# Patient Record
Sex: Female | Born: 1980 | Race: Black or African American | Hispanic: No | Smoking: Current every day smoker
Health system: Southern US, Community
[De-identification: ages and names within clinical notes are randomized; demographics above are authoritative.]

## PROBLEM LIST (undated history)

## (undated) DIAGNOSIS — I10 Essential (primary) hypertension: Secondary | ICD-10-CM

## (undated) DIAGNOSIS — N183 Chronic kidney disease, stage 3 unspecified: Secondary | ICD-10-CM

## (undated) HISTORY — PX: HAND SURGERY: SHX662

## (undated) HISTORY — DX: Essential (primary) hypertension: I10

## (undated) HISTORY — DX: Chronic kidney disease, stage 3 unspecified: N18.30

---

## 2018-07-11 ENCOUNTER — Encounter: Payer: Self-pay | Admitting: Anesthesiology

## 2018-07-11 ENCOUNTER — Emergency Department: Payer: Self-pay

## 2018-07-11 ENCOUNTER — Encounter
Admission: EM | Disposition: A | Discharge status: Home / Self Care | Payer: Self-pay | Source: Home / Self Care | Attending: Emergency Medicine

## 2018-07-11 ENCOUNTER — Observation Stay
Admission: EM | Admit: 2018-07-11 | Discharge: 2018-07-11 | Disposition: A | Discharge status: Home / Self Care | Payer: Self-pay | Attending: Surgery | Admitting: Surgery

## 2018-07-11 ENCOUNTER — Encounter: Payer: Self-pay | Admitting: Emergency Medicine

## 2018-07-11 ENCOUNTER — Other Ambulatory Visit: Payer: Self-pay

## 2018-07-11 DIAGNOSIS — N390 Urinary tract infection, site not specified: Secondary | ICD-10-CM | POA: Insufficient documentation

## 2018-07-11 DIAGNOSIS — S5291XA Unspecified fracture of right forearm, initial encounter for closed fracture: Secondary | ICD-10-CM | POA: Diagnosis present

## 2018-07-11 DIAGNOSIS — S52301A Unspecified fracture of shaft of right radius, initial encounter for closed fracture: Principal | ICD-10-CM | POA: Insufficient documentation

## 2018-07-11 DIAGNOSIS — W010XXA Fall on same level from slipping, tripping and stumbling without subsequent striking against object, initial encounter: Secondary | ICD-10-CM | POA: Insufficient documentation

## 2018-07-11 DIAGNOSIS — F17210 Nicotine dependence, cigarettes, uncomplicated: Secondary | ICD-10-CM | POA: Insufficient documentation

## 2018-07-11 DIAGNOSIS — S52201A Unspecified fracture of shaft of right ulna, initial encounter for closed fracture: Secondary | ICD-10-CM | POA: Insufficient documentation

## 2018-07-11 LAB — CBC WITH DIFFERENTIAL/PLATELET
Abs Immature Granulocytes: 0.03 10*3/uL (ref 0.00–0.07)
BASOS ABS: 0.1 10*3/uL (ref 0.0–0.1)
Basophils Relative: 1 %
EOS PCT: 2 %
Eosinophils Absolute: 0.2 10*3/uL (ref 0.0–0.5)
HCT: 50 % — ABNORMAL HIGH (ref 36.0–46.0)
Hemoglobin: 16 g/dL — ABNORMAL HIGH (ref 12.0–15.0)
Immature Granulocytes: 0 %
Lymphocytes Relative: 41 %
Lymphs Abs: 4.5 10*3/uL — ABNORMAL HIGH (ref 0.7–4.0)
MCH: 28.5 pg (ref 26.0–34.0)
MCHC: 32 g/dL (ref 30.0–36.0)
MCV: 89.1 fL (ref 80.0–100.0)
Monocytes Absolute: 0.7 10*3/uL (ref 0.1–1.0)
Monocytes Relative: 6 %
NRBC: 0 % (ref 0.0–0.2)
Neutro Abs: 5.7 10*3/uL (ref 1.7–7.7)
Neutrophils Relative %: 50 %
Platelets: 182 10*3/uL (ref 150–400)
RBC: 5.61 MIL/uL — ABNORMAL HIGH (ref 3.87–5.11)
RDW: 13.2 % (ref 11.5–15.5)
WBC: 11.1 10*3/uL — ABNORMAL HIGH (ref 4.0–10.5)

## 2018-07-11 LAB — BASIC METABOLIC PANEL
Anion gap: 11 (ref 5–15)
BUN: 9 mg/dL (ref 6–20)
CO2: 20 mmol/L — ABNORMAL LOW (ref 22–32)
Calcium: 9.2 mg/dL (ref 8.9–10.3)
Chloride: 107 mmol/L (ref 98–111)
Creatinine, Ser: 1.21 mg/dL — ABNORMAL HIGH (ref 0.44–1.00)
GFR calc non Af Amer: 58 mL/min — ABNORMAL LOW (ref >60)
Glucose, Bld: 117 mg/dL — ABNORMAL HIGH (ref 70–99)
Potassium: 3.1 mmol/L — ABNORMAL LOW (ref 3.5–5.1)
Sodium: 138 mmol/L (ref 135–145)

## 2018-07-11 LAB — URINALYSIS, ROUTINE W REFLEX MICROSCOPIC
Bacteria, UA: NONE SEEN
Bilirubin Urine: NEGATIVE
Glucose, UA: NEGATIVE mg/dL
Ketones, ur: NEGATIVE mg/dL
NITRITE: NEGATIVE
Protein, ur: 100 mg/dL — AB
Specific Gravity, Urine: 1.015 (ref 1.005–1.030)
WBC, UA: >50 WBC/hpf — ABNORMAL HIGH (ref 0–5)
pH: 5 (ref 5.0–8.0)

## 2018-07-11 LAB — URINE DRUG SCREEN, QUALITATIVE (ARMC ONLY)
Amphetamines, Ur Screen: POSITIVE — AB
BENZODIAZEPINE, UR SCRN: NOT DETECTED
Barbiturates, Ur Screen: NOT DETECTED
Cannabinoid 50 Ng, Ur ~~LOC~~: POSITIVE — AB
Cocaine Metabolite,Ur ~~LOC~~: POSITIVE — AB
MDMA (Ecstasy)Ur Screen: NOT DETECTED
Methadone Scn, Ur: NOT DETECTED
Opiate, Ur Screen: POSITIVE — AB
Phencyclidine (PCP) Ur S: NOT DETECTED
Tricyclic, Ur Screen: NOT DETECTED

## 2018-07-11 LAB — SURGICAL PCR SCREEN
MRSA, PCR: NEGATIVE
Staphylococcus aureus: NEGATIVE

## 2018-07-11 LAB — PROTIME-INR
INR: 0.96
Prothrombin Time: 12.7 seconds (ref 11.4–15.2)

## 2018-07-11 SURGERY — OPEN REDUCTION INTERNAL FIXATION (ORIF) RADIAL FRACTURE
Anesthesia: Choice | Laterality: Right

## 2018-07-11 MED ORDER — DOCUSATE SODIUM 100 MG PO CAPS: 100.0000 mg | ORAL_CAPSULE | Freq: Two times a day (BID) | ORAL | Status: DC

## 2018-07-11 MED ORDER — PANTOPRAZOLE SODIUM 40 MG IV SOLR: 40.0000 mg | Freq: Every day | INTRAVENOUS | Status: DC

## 2018-07-11 MED ORDER — FLEET ENEMA 7-19 GM/118ML RE ENEM: 1.0000 | ENEMA | Freq: Once | RECTAL | Status: DC | PRN

## 2018-07-11 MED ORDER — MAGNESIUM HYDROXIDE 400 MG/5ML PO SUSP: 30.0000 mL | Freq: Every day | ORAL | Status: DC | PRN

## 2018-07-11 MED ORDER — SODIUM CHLORIDE 0.9 % IV SOLN
Freq: Once | INTRAVENOUS | Status: AC
  Administered 2018-07-11: 05:00:00 via INTRAVENOUS

## 2018-07-11 MED ORDER — ONDANSETRON HCL 4 MG/2ML IJ SOLN
4.0000 mg | INTRAMUSCULAR | Status: AC
  Administered 2018-07-11: 4 mg via INTRAVENOUS
  Filled 2018-07-11: qty 2

## 2018-07-11 MED ORDER — SODIUM CHLORIDE 0.9 % IV SOLN
1.0000 g | INTRAVENOUS | Status: AC
  Administered 2018-07-11: 1 g via INTRAVENOUS
  Filled 2018-07-11: qty 10

## 2018-07-11 MED ORDER — BISACODYL 10 MG RE SUPP
10.0000 mg | Freq: Every day | RECTAL | Status: DC | PRN
  Filled 2018-07-11: qty 1

## 2018-07-11 MED ORDER — CEFAZOLIN SODIUM-DEXTROSE 2-4 GM/100ML-% IV SOLN
2.0000 g | Freq: Once | INTRAVENOUS | Status: DC
  Filled 2018-07-11: qty 100

## 2018-07-11 MED ORDER — SULFAMETHOXAZOLE-TRIMETHOPRIM 800-160 MG PO TABS: 1.0000 | ORAL_TABLET | Freq: Two times a day (BID) | ORAL | 0 refills | Status: DC

## 2018-07-11 MED ORDER — OXYCODONE HCL 5 MG PO TABS: 5.0000 mg | ORAL_TABLET | ORAL | 0 refills | Status: DC | PRN

## 2018-07-11 MED ORDER — KCL IN DEXTROSE-NACL 20-5-0.9 MEQ/L-%-% IV SOLN
INTRAVENOUS | Status: DC
  Administered 2018-07-11: 08:00:00 via INTRAVENOUS
  Filled 2018-07-11 (×3): qty 1000

## 2018-07-11 MED ORDER — HYDROMORPHONE HCL 1 MG/ML IJ SOLN
0.5000 mg | INTRAMUSCULAR | Status: DC | PRN
  Administered 2018-07-11 (×2): 1 mg via INTRAVENOUS
  Filled 2018-07-11 (×2): qty 1

## 2018-07-11 MED ORDER — MORPHINE SULFATE (PF) 4 MG/ML IV SOLN
8.0000 mg | Freq: Once | INTRAVENOUS | Status: AC
  Administered 2018-07-11: 8 mg via INTRAVENOUS
  Filled 2018-07-11: qty 2

## 2018-07-11 MED ORDER — ACETAMINOPHEN 650 MG RE SUPP: 650.0000 mg | Freq: Four times a day (QID) | RECTAL | Status: DC | PRN

## 2018-07-11 MED ORDER — ACETAMINOPHEN 325 MG PO TABS: 650.0000 mg | ORAL_TABLET | Freq: Four times a day (QID) | ORAL | Status: DC | PRN

## 2018-07-11 MED ORDER — FENTANYL CITRATE (PF) 100 MCG/2ML IJ SOLN
100.0000 ug | Freq: Once | INTRAMUSCULAR | Status: AC | PRN
  Administered 2018-07-11: 100 ug via INTRAVENOUS
  Filled 2018-07-11: qty 2

## 2018-07-11 SURGICAL SUPPLY — 39 items
BNDG COHESIVE 4X5 TAN STRL (GAUZE/BANDAGES/DRESSINGS) ×3 IMPLANT
BNDG ESMARK 4X12 TAN STRL LF (GAUZE/BANDAGES/DRESSINGS) ×3 IMPLANT
CANISTER SUCT 1200ML W/VALVE (MISCELLANEOUS) ×3 IMPLANT
CHLORAPREP W/TINT 26ML (MISCELLANEOUS) ×6 IMPLANT
CORD BIP STRL DISP 12FT (MISCELLANEOUS) ×3 IMPLANT
COVER WAND RF STERILE (DRAPES) ×3 IMPLANT
CUFF TOURN 18 STER (MISCELLANEOUS) IMPLANT
DRAPE FLUOR MINI C-ARM 54X84 (DRAPES) ×3 IMPLANT
DRAPE IMP U-DRAPE 54X76 (DRAPES) ×3 IMPLANT
DRAPE SURG 17X11 SM STRL (DRAPES) ×3 IMPLANT
DRAPE U-SHAPE 47X51 STRL (DRAPES) ×3 IMPLANT
ELECT CAUTERY BLADE 6.4 (BLADE) ×3 IMPLANT
ELECT REM PT RETURN 9FT ADLT (ELECTROSURGICAL) ×3
ELECTRODE REM PT RTRN 9FT ADLT (ELECTROSURGICAL) ×1 IMPLANT
FORCEPS JEWEL BIP 4-3/4 STR (INSTRUMENTS) ×3 IMPLANT
GAUZE PETRO XEROFOAM 1X8 (MISCELLANEOUS) ×3 IMPLANT
GAUZE SPONGE 4X4 12PLY STRL (GAUZE/BANDAGES/DRESSINGS) ×3 IMPLANT
GLOVE BIO SURGEON STRL SZ8 (GLOVE) ×3 IMPLANT
GLOVE INDICATOR 8.0 STRL GRN (GLOVE) ×3 IMPLANT
GOWN STRL REUS W/ TWL LRG LVL3 (GOWN DISPOSABLE) ×1 IMPLANT
GOWN STRL REUS W/ TWL XL LVL3 (GOWN DISPOSABLE) ×1 IMPLANT
GOWN STRL REUS W/TWL LRG LVL3 (GOWN DISPOSABLE) ×2
GOWN STRL REUS W/TWL XL LVL3 (GOWN DISPOSABLE) ×2
KIT TURNOVER KIT A (KITS) ×3 IMPLANT
NEEDLE FILTER BLUNT 18X 1/2SAF (NEEDLE) ×2
NEEDLE FILTER BLUNT 18X1 1/2 (NEEDLE) ×1 IMPLANT
NS IRRIG 500ML POUR BTL (IV SOLUTION) ×3 IMPLANT
PACK EXTREMITY ARMC (MISCELLANEOUS) ×3 IMPLANT
PAD CAST CTTN 4X4 STRL (SOFTGOODS) ×2 IMPLANT
PADDING CAST COTTON 4X4 STRL (SOFTGOODS) ×4
SPLINT CAST 1 STEP 3X12 (MISCELLANEOUS) ×3 IMPLANT
STAPLER SKIN PROX 35W (STAPLE) ×3 IMPLANT
STOCKINETTE IMPERVIOUS 9X36 MD (GAUZE/BANDAGES/DRESSINGS) ×3 IMPLANT
SUT PROLENE 4 0 PS 2 18 (SUTURE) ×3 IMPLANT
SUT VIC AB 2-0 SH 27 (SUTURE) ×2
SUT VIC AB 2-0 SH 27XBRD (SUTURE) ×1 IMPLANT
SUT VIC AB 3-0 SH 27 (SUTURE) ×2
SUT VIC AB 3-0 SH 27X BRD (SUTURE) ×1 IMPLANT
SYR 10ML LL (SYRINGE) ×3 IMPLANT

## 2018-07-11 NOTE — ED Notes (Signed)
Transport patient to floor room 149.AS

## 2018-07-11 NOTE — Discharge Instructions (Signed)
Orthopedic discharge instructions: Keep splint dry and intact. Keep hand elevated above heart level. Apply ice to affected area frequently. Take pain medication as prescribed or ES Tylenol when needed.  Return for follow-up in 3-4 days.

## 2018-07-11 NOTE — ED Triage Notes (Signed)
Pt arrives to ED with obvious deformity to right arm. Pt is screaming at this time.

## 2018-07-11 NOTE — Progress Notes (Signed)
DISCHARGE NOTE:  Pt given discharge instructions and script (oxycodone, bactrim). Pt verbalized understanding. Pt wheeled to car by staff.

## 2018-07-11 NOTE — H&P (Signed)
Subjective:  Chief complaint: Right forearm injury.  The patient is a 38 y.o. female who sustained an injury to the right forearm early this morning when she apparently slipped on some mud and fell onto her outstretched right hand.  She was brought to the emergency room where x-rays demonstrated a displaced both bone forearm fracture.  The patient was splinted and admitted in preparation for definitive management of this injury.  The patient denies any associated injury or loss of consciousness associated with the injury, and denies any light-headedness, loss of consciousness, chest pain, or shortness of breath which might have contributed to the injury.  The patient does note some tingling to the index finger, but denies any other symptoms other than pain.  Patient Active Problem List   Diagnosis Date Noted  . Forearm fracture, right, closed, initial encounter 07/11/2018   History reviewed. No pertinent past medical history.  Past Surgical History:  Procedure Laterality Date  . HAND SURGERY      Medications Prior to Admission  Medication Sig Dispense Refill Last Dose  . TESTOSTERONE PROPIONATE IM Inject into the muscle once a week.   07/04/2018 at Unknown time   Allergies  Allergen Reactions  . Ibuprofen     Social History   Tobacco Use  . Smoking status: Current Every Day Smoker    Types: Cigarettes  . Smokeless tobacco: Never Used  Substance Use Topics  . Alcohol use: Not Currently    No family history on file.   Review of Systems: As noted above. The patient denies any chest pain, shortness of breath, nausea, vomiting, diarrhea, constipation, belly pain, blood in his/her stool, or burning with urination.  Objective: Temp:  [97.9 F (36.6 C)-98 F (36.7 C)] 98 F (36.7 C) (01/05 0652) Pulse Rate:  [69-84] 80 (01/05 0652) Resp:  [15-24] 15 (01/05 0652) BP: (135-158)/(78-94) 137/94 (01/05 0530) SpO2:  [92 %-98 %] 95 % (01/05 0652) Weight:  [95.3 kg] 95.3 kg (01/05  0241)  Physical Exam: General:  Alert, no acute distress Psychiatric:  Patient is competent for consent with normal mood and affect Cardiovascular:  RRR  Respiratory:  Clear to auscultation. No wheezing. Non-labored breathing GI:  Abdomen is soft and non-tender Skin:  No lesions in the area of chief complaint Neurologic:  Sensation intact distally Lymphatic:  No axillary or cervical lymphadenopathy  Orthopedic Exam:  Orthopedic examination is limited to the right upper extremity and hand.  A sugar tong splint has been applied to the right forearm and appears to be intact.  Skin is intact at the proximal and distal margins of the splint.  The patient is able to flex and extend the thumb and all other digits slightly, although range of motion is limited by pain and apprehension.  Sensation is intact light touch to the tips of all digits, although she notes mild "tingling" to the index finger tip.  She has excellent capillary refill to all digits.  Imaging Review: Recent AP and lateral x-rays of the right forearm are available for review.  These films demonstrate displaced fracture midshaft fractures of both the radius and ulna.  Minimal comminution of the ulnar fracture is noted.  No other acute bony abnormalities are identified.  Assessment: Closed displaced right both bone forearm fracture.  Plan: The treatment options, including both surgical and nonsurgical choices, have been discussed in detail with the patient and her friend, who is at the bedside.  I have explained to the patient that this injury requires surgical  stabilization, specifically an open reduction and internal fixation of both the radius and ulnar shaft fractures.  This procedure has been discussed in detail with the patient, as have the potential risks, including bleeding, infection, nerve and blood vessel injury, persistent recurrent pain, malunion or nonunion, forearm/wrist stiffness, need for further surgery, blood clots,  strokes, heart attacks and arrhythmias, etc.) benefits.  The patient states her understanding wishes to proceed.    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  Therefore, the patient will be discharged home and arrangements will be made for this procedure to be done later this week, assuming she remains clean.  The patient and her friend are not happy with this plan, but I have explained to them that I cannot do the surgery without the patient being put to sleep.  She has mentioned that she may choose to go to another facility.  I have responded that this would be her choice.

## 2018-07-11 NOTE — ED Provider Notes (Signed)
[facility] Emergency Department Provider Note  ____________________________________________   First MD Initiated Contact with Patient 07/11/18 [phone]     (approximate)  I have reviewed the triage vital signs and the nursing notes.   HISTORY  Chief Complaint Arm Pain  Level 5 caveat:  history/ROS limited by severe pain and she is unable to provide extensive history.  History is provided by her companion.  HPI Gwendolyn Harrison is a 38 y.o. female who presents by private vehicle for evaluation of acute onset and severe pain in the right forearm.  Her companion reports that the patient slipped in the mud while headed to her car and fell on her arm causing acute onset severe constant pain.  The pain is worse with any movement and she reports that she cannot or will not move her fingers.  No additional history is available other than that the patient has had prior surgery on her right hand already.  The surgery occurred years ago and was not around here.  She did not sustain any injuries other than to the right forearm.  She is left-hand dominant.  History reviewed. No pertinent past medical history.  Patient Active Problem List   Diagnosis Date Noted  . Forearm fracture, right, closed, initial encounter 07/11/2018    Past Surgical History:  Procedure Laterality Date  . HAND SURGERY      Prior to Admission medications   Medication Sig Start Date End Date Taking? Authorizing Provider  TESTOSTERONE PROPIONATE IM Inject into the muscle once a week.   Yes [provider]    Allergies Ibuprofen  No family history on file.  Social History Social History   Tobacco Use  . Smoking status: Current Every Day Smoker    Types: Cigarettes  . Smokeless tobacco: Never Used  Substance Use Topics  . Alcohol use: Not Currently  . Drug use: Never    Review of Systems Level 5 caveat:  history/ROS limited by severe pain and she is unable to provide extensive  history.  History is provided by her companion.  The only pain the patient is reporting at this time is in the right forearm.  ____________________________________________   PHYSICAL EXAM:  ED Triage Vitals  Enc Vitals Group     BP 07/11/18 0247 (!) 158/78     Pulse Rate 07/11/18 0247 84     Resp 07/11/18 0247 (!) 24     Temp 07/11/18 0247 97.9 F (36.6 C)     Temp Source 07/11/18 0247 Oral     SpO2 07/11/18 0247 98 %     Weight 07/11/18 0241 95.3 kg (210 lb)     Height 07/11/18 0241 1.727 m (5\' 8" )     Head Circumference --      Peak Flow --      Pain Score 07/11/18 0244 10     Pain Loc --      Pain Edu? --      Excl. in GC? --      Constitutional: Awake and alert but screaming in pain and minimally cooperative with ED provider and staff. Eyes: Conjunctivae are normal.  Head: Atraumatic. Neck: No stridor.  No meningeal signs.  No cervical spine tenderness to palpation. Cardiovascular: Normal rate, regular rhythm. Good peripheral circulation. Grossly normal heart sounds. Respiratory: Normal respiratory effort.  No retractions. Lungs CTAB. Gastrointestinal: Soft and nontender. No distention.  Musculoskeletal: Deformity of distal right forearm which is likely consistent with a radial fracture.  Additional evaluation  is not possible due to the patient's pain and lack of cooperation.  Palpable right radial pulse although any palpation causes the patient to scream and attempt to move away.  Compartments are soft and easily compressible at this time. Neurologic: Patient reports numbness and tingling in her fingers.  Otherwise unable to participate in exam. Skin:  Skin is warm, dry and intact.  The patient has innumerable old healing scars that appear most consistent with self-inflicted cutting behavior.   ____________________________________________   LABS (all labs ordered are listed, but only abnormal results are displayed)  Labs Reviewed  BASIC METABOLIC PANEL - Abnormal;  Notable for the following components:      Result Value   Potassium 3.1 (*)    CO2 20 (*)    Glucose, Bld 117 (*)    Creatinine, Ser 1.21 (*)    GFR calc non Af Amer 58 (*)    All other components within normal limits  CBC WITH DIFFERENTIAL/PLATELET - Abnormal; Notable for the following components:   WBC 11.1 (*)    RBC 5.61 (*)    Hemoglobin 16.0 (*)    HCT 50.0 (*)    Lymphs Abs 4.5 (*)    All other components within normal limits  URINALYSIS, ROUTINE W REFLEX MICROSCOPIC - Abnormal; Notable for the following components:   Color, Urine YELLOW (*)    APPearance HAZY (*)    Hgb urine dipstick MODERATE (*)    Protein, ur 100 (*)    Leukocytes, UA LARGE (*)    WBC, UA >50 (*)    All other components within normal limits  URINE DRUG SCREEN, QUALITATIVE ([facility] ONLY) - Abnormal; Notable for the following components:   Amphetamines, Ur Screen POSITIVE (*)    Cocaine Metabolite,Ur [place] POSITIVE (*)    Opiate, Ur Screen POSITIVE (*)    Cannabinoid 50 Ng, Ur [place] POSITIVE (*)    All other components within normal limits  URINE CULTURE  PROTIME-INR  POC URINE PREG, ED   ____________________________________________  EKG  None - EKG not ordered by ED physician ____________________________________________  RADIOLOGY Marylou Mccoy, personally viewed and evaluated these images (plain radiographs) as part of my medical decision making, as well as reviewing the written report by the radiologist.  ED MD interpretation: Severely displaced fractures of the midshaft of both the radius and ulna with dorsal displacement and shortening.  Official radiology report(s): Dg Forearm Right  Result Date: 07/11/2018 CLINICAL DATA:  Status post fall. Left forearm pain and deformity. Initial encounter. EXAM: RIGHT FOREARM - 2 VIEW COMPARISON:  None. FINDINGS: There are significantly displaced fractures of the midshafts of the radius and ulna, with more than 1 shaft width dorsal displacement and shortening,  and mild rotation. Surrounding soft tissue swelling is noted. The elbow joint is grossly unremarkable in appearance. No elbow joint effusion is identified. The carpal rows appear grossly intact, and demonstrate normal alignment. IMPRESSION: Significantly displaced fractures of the midshafts of the radius and ulna, with more than 1 shaft width dorsal displacement and shortening, and mild rotation. Electronically Signed   By: Roanna Raider M.D.   On: 07/11/2018 03:12    ____________________________________________   PROCEDURES  Critical Care performed: No   Procedure(s) performed:   .Splint Application Date/Time: 07/11/2018 4:03 AM Performed by: Loleta Rose, MD Authorized by: Loleta Rose, MD   Consent:    Consent obtained:  Verbal and emergent situation   Consent given by:  Patient   Risks discussed:  Numbness, pain and  swelling Pre-procedure details:    Sensation:  Numbness and weakness Procedure details:    Laterality:  Right   Location:  Arm   Arm:  R lower arm   Splint type:  Sugar tong   Supplies:  Ortho-Glass Post-procedure details:    Pain:  Unchanged   Patient tolerance of procedure:  Tolerated with difficulty Comments:     tolerated with difficulty due to pain     ____________________________________________   INITIAL IMPRESSION / ASSESSMENT AND PLAN / ED COURSE  As part of my medical decision making, I reviewed the following data within the electronic MEDICAL RECORD NUMBER History obtained from family, Nursing notes reviewed and incorporated, Labs reviewed , Radiograph reviewed , Discussed with admitting physician , Discussed with radiologist, A consult was requested from this/these consultant(s) Orthopedics (talked with Dr. Joice Lofts by phone) and Notes from prior ED visits    Differential diagnosis includes, but is not limited to, midshaft fractures, development of compartment syndrome, hand or upper arm fractures, neurovascular compromise due to the severity of  fracture.  Anticipate that fractures will be present but they seem to be isolated to the forearm.  At this time the compartments are soft and easily compressible although any attempt at physical exam results and severe pain and causes the patient to scream.  I am ordering morphine 8 mg IV and Zofran 4 mg IV as well as radiographs of the right arm and basic lab work.  I will not place the arm in a splint until after we have imaging to determine what needs to be done.  I did asked the patient if she was sure that this injury occurred as result of a fall and she insists that it was from the fall and that she was not harmed by anyone.  Clinical Course as of Jul 11 600  Wynelle Link Jul 11, 2018  0344 I spoke with Dr. Cherly Hensen with radiology to verify the results of the imaging as well as to ask about any apparent injury to the patient's hand.  He said that there might be some evidence of prior injury but there is no hardware in place and no evidence of acute fracture to the hand in spite of the severely displaced fractures of the midshaft of her radius and ulna.  I will contact Dr. Reginia Naas with orthopedics to discuss.  DG Forearm Right [CF]  0405 I have already spoken with Dr. Joice Lofts about the case.  He reviewed the radiographs and agreed that he would admit the patient to the hospital for surgery later this morning.  He requested that I not attempt any reduction but that we place the patient in a sugar tong splint for stabilization.  I updated the patient and her companion and they understand and agree with the plan.  I explained that it was necessary to check a urinalysis to prepare for surgery and we will also be sending a protime/INR.  I have ordered fentanyl 100 mcg IV to be administered just prior to splint application.  I have made the patient n.p.o. and ordered a normal saline infusion at 100 mL/h.   [CF]  0453 I reassessed the patient after splint placement and the compartments that are accessible to me remain soft  and easily compressible.  The patient still is unwilling or unable to move her fingers but reports no change of her neurovascular status after the splint placement.  She has normal capillary refill in her fingers.   [CF]    Clinical Course  User Index [CF] Loleta RoseForbach, Ephrata Verville, MD    ____________________________________________  FINAL CLINICAL IMPRESSION(S) / ED DIAGNOSES  Final diagnoses:  Closed fracture of shaft of radius and ulna, right, initial encounter  Urinary tract infection without hematuria, site unspecified     MEDICATIONS GIVEN DURING THIS VISIT:  Medications  HYDROmorphone (DILAUDID) injection 0.5-1 mg (1 mg Intravenous Given 07/11/18 0507)  ceFAZolin (ANCEF) IVPB 2g/100 mL premix (has no administration in time range)  cefTRIAXone (ROCEPHIN) 1 g in sodium chloride 0.9 % 100 mL IVPB (1 g Intravenous New Bag/Given 07/11/18 0544)  morphine 4 MG/ML injection 8 mg (8 mg Intravenous Given 07/11/18 0251)  ondansetron (ZOFRAN) injection 4 mg (4 mg Intravenous Given 07/11/18 0251)  fentaNYL (SUBLIMAZE) injection 100 mcg (100 mcg Intravenous Given 07/11/18 0405)  0.9 %  sodium chloride infusion ( Intravenous New Bag/Given 07/11/18 0434)     ED Discharge Orders    None       Note:  This document was prepared using Dragon voice recognition software and may include unintentional dictation errors.    Loleta RoseForbach, Ranya Fiddler, MD 07/11/18 [phone]

## 2018-07-12 LAB — URINE CULTURE
Culture: <10000 — AB
Special Requests: NORMAL

## 2018-10-09 ENCOUNTER — Other Ambulatory Visit: Payer: Self-pay

## 2018-10-09 ENCOUNTER — Emergency Department
Admission: EM | Admit: 2018-10-09 | Discharge: 2018-10-09 | Disposition: A | Discharge status: Home / Self Care | Payer: Medicaid Other | Attending: Emergency Medicine | Admitting: Emergency Medicine

## 2018-10-09 ENCOUNTER — Encounter: Payer: Self-pay | Admitting: Emergency Medicine

## 2018-10-09 ENCOUNTER — Emergency Department: Payer: Medicaid Other

## 2018-10-09 DIAGNOSIS — Y999 Unspecified external cause status: Secondary | ICD-10-CM | POA: Insufficient documentation

## 2018-10-09 DIAGNOSIS — S93402A Sprain of unspecified ligament of left ankle, initial encounter: Secondary | ICD-10-CM | POA: Insufficient documentation

## 2018-10-09 DIAGNOSIS — Y929 Unspecified place or not applicable: Secondary | ICD-10-CM | POA: Insufficient documentation

## 2018-10-09 DIAGNOSIS — X500XXA Overexertion from strenuous movement or load, initial encounter: Secondary | ICD-10-CM | POA: Insufficient documentation

## 2018-10-09 DIAGNOSIS — Y9389 Activity, other specified: Secondary | ICD-10-CM | POA: Insufficient documentation

## 2018-10-09 DIAGNOSIS — F17210 Nicotine dependence, cigarettes, uncomplicated: Secondary | ICD-10-CM | POA: Insufficient documentation

## 2018-10-09 DIAGNOSIS — S93602A Unspecified sprain of left foot, initial encounter: Secondary | ICD-10-CM | POA: Insufficient documentation

## 2018-10-09 MED ORDER — TRAMADOL HCL 50 MG PO TABS: 50.0000 mg | ORAL_TABLET | Freq: Four times a day (QID) | ORAL | 0 refills | Status: DC | PRN

## 2018-10-09 NOTE — ED Notes (Signed)
Ace wrap applied and crutches given to pt adjusted for size.

## 2018-10-09 NOTE — ED Provider Notes (Signed)
[facility] Emergency Department Provider Note   ____________________________________________   First MD Initiated Contact with Patient 10/09/18 1655     (approximate)  I have reviewed the triage vital signs and the nursing notes.   HISTORY  Chief Complaint Fall   HPI Gwendolyn Harrison is a 38 y.o. female presents today with complaint of left foot and ankle pain.  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  She states that today she is unable to apply pressure and walk without severe pain.  She denies any previous fractures in this area.  She has not taken any over-the-counter medication.  She denies any head injury or loss of consciousness last evening.  She rates her pain as a 10/10.     History reviewed. No pertinent past medical history.  Patient Active Problem List   Diagnosis Date Noted  . Forearm fracture, right, closed, initial encounter 07/11/2018    Past Surgical History:  Procedure Laterality Date  . HAND SURGERY      Prior to Admission medications   Medication Sig Start Date End Date Taking? Authorizing Provider  TESTOSTERONE PROPIONATE IM Inject into the muscle once a week.    [provider]  traMADol (ULTRAM) 50 MG tablet Take 1 tablet (50 mg total) by mouth every 6 (six) hours as needed. 10/09/18   Tommi Rumps, PA-C    Allergies Ibuprofen  No family history on file.  Social History Social History   Tobacco Use  . Smoking status: Current Every Day Smoker    Types: Cigarettes  . Smokeless tobacco: Never Used  Substance Use Topics  . Alcohol use: Not Currently  . Drug use: Never    Review of Systems Constitutional: No fever/chills Cardiovascular: Denies chest pain. Respiratory: Denies shortness of breath. Musculoskeletal: Positive for left ankle and foot pain. Skin: Negative for rash. Neurological: Negative for headaches, focal weakness or numbness.  ___________________________________________   PHYSICAL EXAM:  VITAL SIGNS: ED Triage Vitals  Enc Vitals Group     BP 10/09/18 1633 128/83     Pulse Rate 10/09/18 1633 90     Resp 10/09/18 1633 18     Temp 10/09/18 1633 98.2 F (36.8 C)     Temp Source 10/09/18 1633 Oral     SpO2 10/09/18 1633 98 %     Weight 10/09/18 1634 200 lb (90.7 kg)     Height 10/09/18 1634 5\' 5"  (1.651 m)     Head Circumference --      Peak Flow --      Pain Score 10/09/18 1634 10     Pain Loc --      Pain Edu? --      Excl. in GC? --    Constitutional: Alert and oriented. Well appearing and in no acute distress. Eyes: Conjunctivae are normal.  Head: Atraumatic. Neck: No stridor.   Cardiovascular: Normal rate, regular rhythm. Grossly normal heart sounds.  Good peripheral circulation. Respiratory: Normal respiratory effort.  No retractions. Lungs CTAB. Musculoskeletal: On examination of the left ankle there is no gross deformity however there is soft tissue tenderness especially on the lateral aspect.  No ecchymosis or abrasions were seen.  On examination of the foot dorsal aspect there is some soft tissue edema present.  Generalized tenderness to the dorsal aspect.  Patient is able to move digits without any difficulty.  Motor sensory function intact.  Capillary refills less than 3 seconds. Neurologic:  Normal speech and language.  No gross focal neurologic deficits are appreciated.  Skin:  Skin is warm, dry and intact.  Psychiatric: Mood and affect are normal. Speech and behavior are normal.  ____________________________________________   LABS (all labs ordered are listed, but only abnormal results are displayed)  Labs Reviewed - No data to display RADIOLOGY   Official radiology report(s): Dg Ankle Complete Left  Result Date: 10/09/2018 CLINICAL DATA:  38 year old female with injury to the left foot/ankle. EXAM: LEFT FOOT - COMPLETE 3+ VIEW; LEFT ANKLE COMPLETE - 3+ VIEW COMPARISON:  None.  FINDINGS: There is no acute fracture or dislocation. The bones are well mineralized. No arthritic changes. The ankle mortise is intact. There is soft tissue swelling over the lateral malleolus. No radiopaque foreign object. IMPRESSION: 1. No acute fracture or dislocation. 2. Soft tissue swelling over the lateral malleolus. Electronically Signed   By: Elgie Collard M.D.   On: 10/09/2018 17:32   Dg Foot Complete Left  Result Date: 10/09/2018 CLINICAL DATA:  38 year old female with injury to the left foot/ankle. EXAM: LEFT FOOT - COMPLETE 3+ VIEW; LEFT ANKLE COMPLETE - 3+ VIEW COMPARISON:  None. FINDINGS: There is no acute fracture or dislocation. The bones are well mineralized. No arthritic changes. The ankle mortise is intact. There is soft tissue swelling over the lateral malleolus. No radiopaque foreign object. IMPRESSION: 1. No acute fracture or dislocation. 2. Soft tissue swelling over the lateral malleolus. Electronically Signed   By: Elgie Collard M.D.   On: 10/09/2018 17:32    ____________________________________________   PROCEDURES  Procedure(s) performed (including Critical Care):  Procedures Ace wrap and crutches were done by the RN.  ____________________________________________   INITIAL IMPRESSION / ASSESSMENT AND PLAN / ED COURSE  As part of my medical decision making, I reviewed the following data within the electronic MEDICAL RECORD NUMBER Notes from prior ED visits and [place] Controlled Substance Database  Patient presents to the ED with complaint of left foot pain since last evening when she tripped.  Physical exam shows soft tissue swelling but no gross deformity was noted.  X-rays were negative for acute bony injury.  Patient was placed in an Ace wrap with instructions to ice and elevate.  She was also given crutches to avoid weightbearing at this time.  A prescription for tramadol was given to take every 6 hours as needed for pain.  She will follow-up with her PCP or Dr.  Martha Clan if any continued problems.  ____________________________________________   FINAL CLINICAL IMPRESSION(S) / ED DIAGNOSES  Final diagnoses:  Sprain of left ankle, unspecified ligament, initial encounter  Sprain of left foot, initial encounter     ED Discharge Orders         Ordered    traMADol (ULTRAM) 50 MG tablet  Every 6 hours PRN     10/09/18 1757           Note:  This document was prepared using Dragon voice recognition software and may include unintentional dictation errors.    Tommi Rumps, PA-C 10/09/18 1916    Sharman Cheek, MD 10/10/18 2245

## 2018-10-09 NOTE — Discharge Instructions (Signed)
Follow-up with your primary care provider or Dr. Martha Clan who is on-call for orthopedics if any worsening of your ankle sprain.  Use crutches for walking.  Ice and elevation to reduce swelling and help with pain.  Take tramadol every 6 hours as needed for pain.

## 2018-10-09 NOTE — ED Triage Notes (Signed)
States she tripped last night, fell and now has pain in her left foot/ankle. NAD. States she is unable to put pressure on it at this time.

## 2019-05-31 IMAGING — DX DG FOREARM 2V*R*
2 series · 2 of 2 positions shown · non-contrast
Comparison: None.

CLINICAL DATA: Status post fall. Left forearm pain and deformity.
Initial encounter.

EXAM:
RIGHT FOREARM - 2 VIEW

[forearm ap]
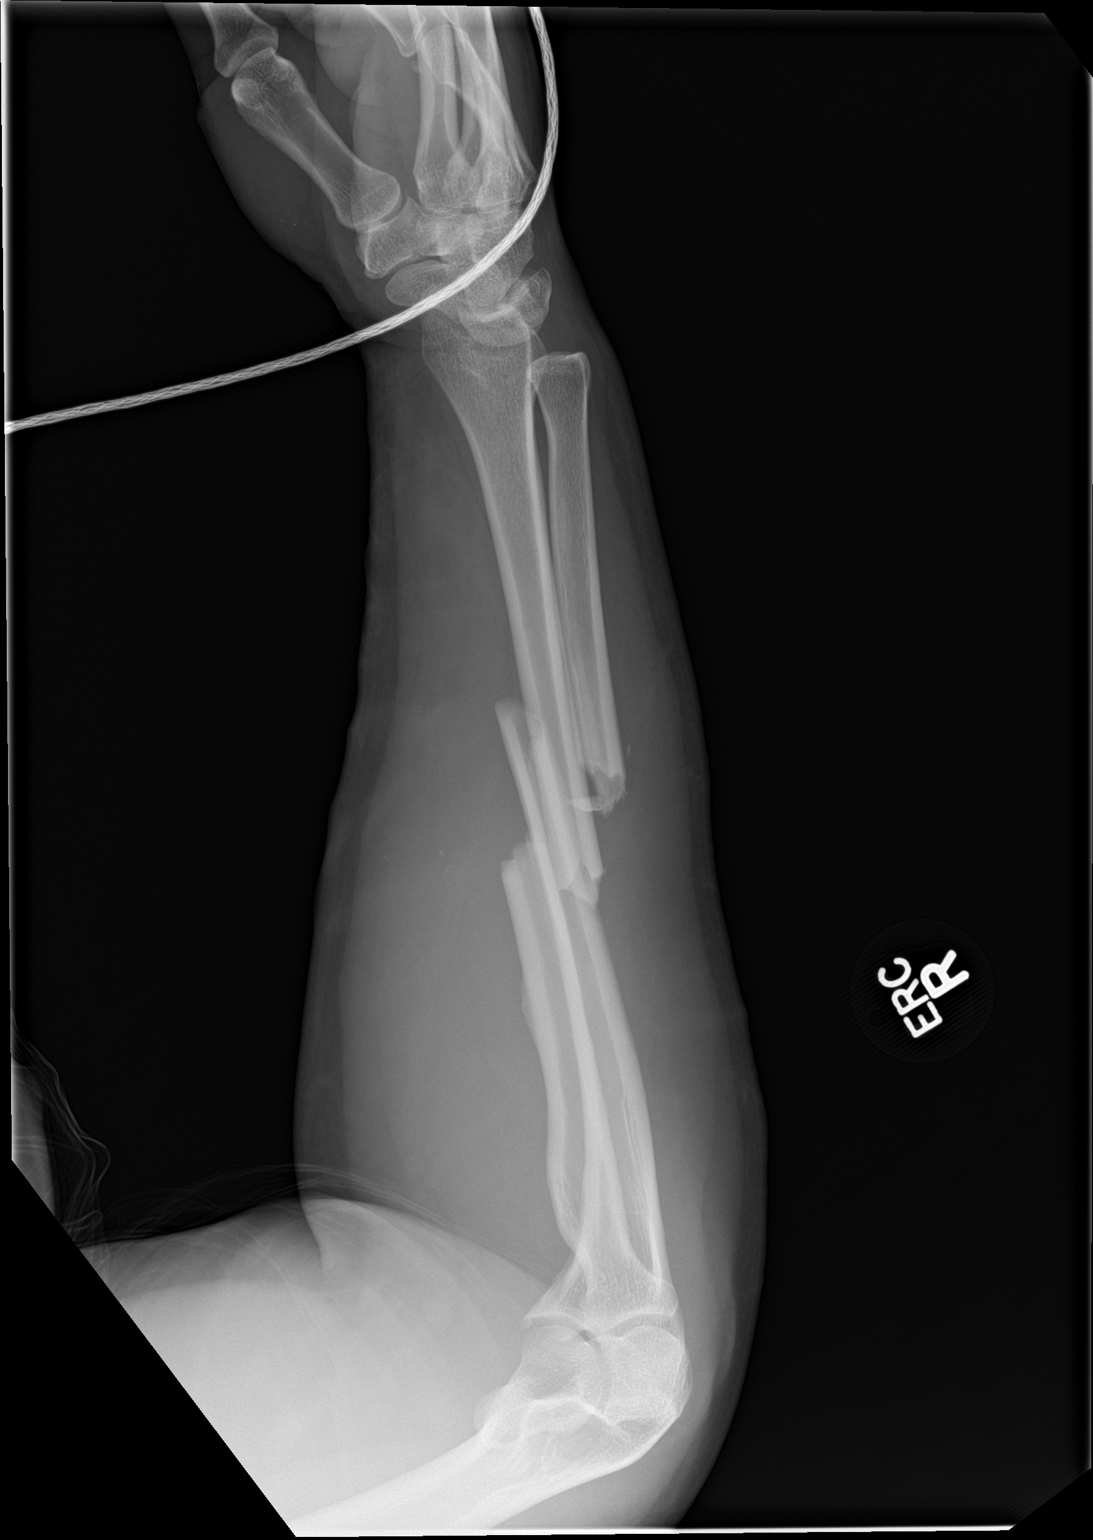

[forearm lat]
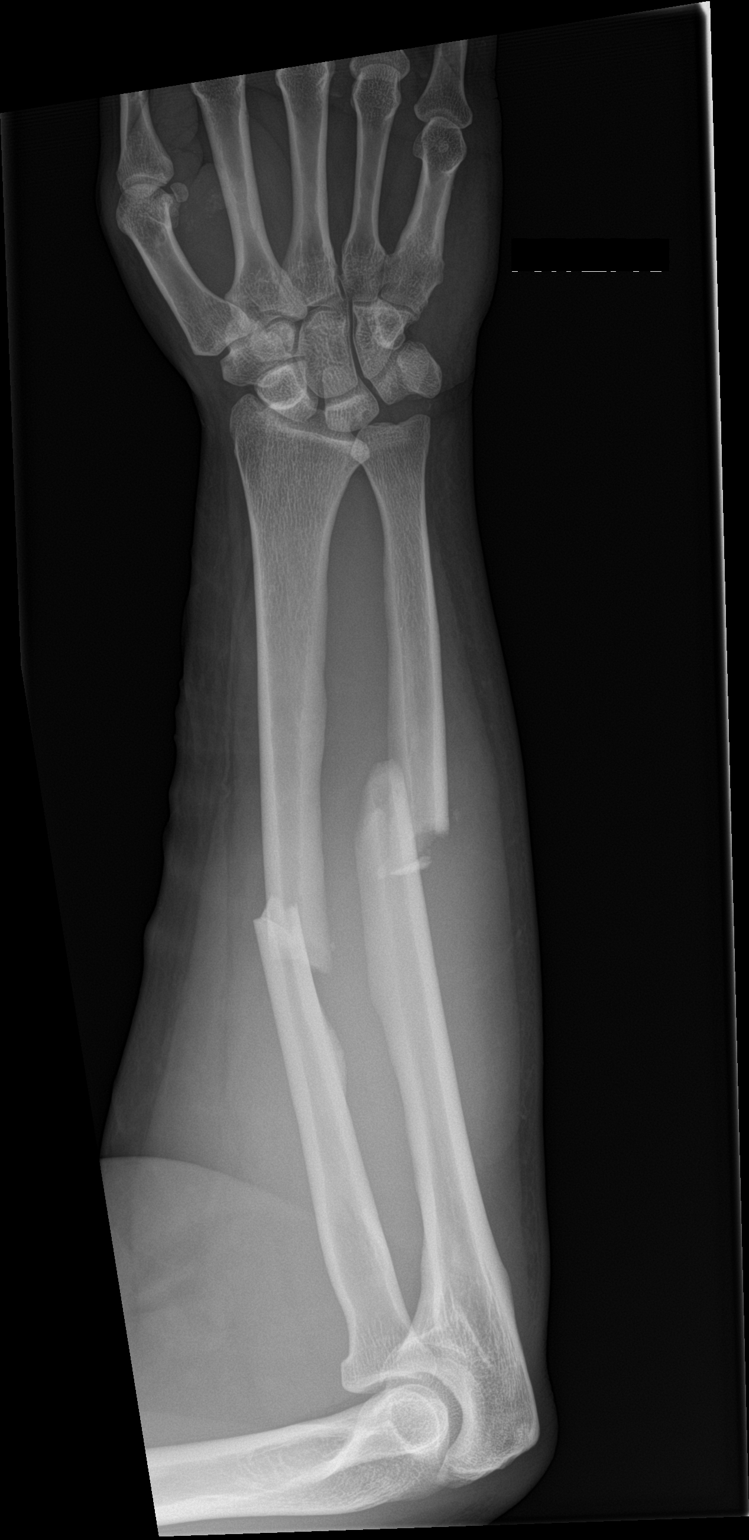

[2 of 2 positions shown; findings below may reference images not displayed]

FINDINGS: There are significantly displaced fractures of the midshafts of the
radius and ulna, with more than 1 shaft width dorsal displacement
and shortening, and mild rotation. Surrounding soft tissue swelling
is noted.

The elbow joint is grossly unremarkable in appearance. No elbow
joint effusion is identified. The carpal rows appear grossly intact,
and demonstrate normal alignment.
IMPRESSION: Significantly displaced fractures of the midshafts of the radius and
ulna, with more than 1 shaft width dorsal displacement and
shortening, and mild rotation.

## 2019-12-14 ENCOUNTER — Other Ambulatory Visit: Payer: Self-pay

## 2019-12-14 ENCOUNTER — Emergency Department: Payer: Worker's Compensation

## 2019-12-14 DIAGNOSIS — X500XXA Overexertion from strenuous movement or load, initial encounter: Secondary | ICD-10-CM | POA: Diagnosis not present

## 2019-12-14 DIAGNOSIS — Y939 Activity, unspecified: Secondary | ICD-10-CM | POA: Insufficient documentation

## 2019-12-14 DIAGNOSIS — S6991XA Unspecified injury of right wrist, hand and finger(s), initial encounter: Secondary | ICD-10-CM | POA: Diagnosis present

## 2019-12-14 DIAGNOSIS — F17210 Nicotine dependence, cigarettes, uncomplicated: Secondary | ICD-10-CM | POA: Insufficient documentation

## 2019-12-14 DIAGNOSIS — Y9259 Other trade areas as the place of occurrence of the external cause: Secondary | ICD-10-CM | POA: Insufficient documentation

## 2019-12-14 DIAGNOSIS — S63612A Unspecified sprain of right middle finger, initial encounter: Secondary | ICD-10-CM | POA: Insufficient documentation

## 2019-12-14 DIAGNOSIS — Z79899 Other long term (current) drug therapy: Secondary | ICD-10-CM | POA: Insufficient documentation

## 2019-12-14 DIAGNOSIS — Y990 Civilian activity done for income or pay: Secondary | ICD-10-CM | POA: Insufficient documentation

## 2019-12-14 NOTE — ED Triage Notes (Signed)
Pt was at work a day go and machine pulled his right 3rd middle finger back, pt co pain to hand and finger.

## 2019-12-15 ENCOUNTER — Emergency Department
Admission: EM | Admit: 2019-12-15 | Discharge: 2019-12-15 | Disposition: A | Discharge status: Home / Self Care | Payer: Worker's Compensation | Attending: Emergency Medicine | Admitting: Emergency Medicine

## 2019-12-15 DIAGNOSIS — S63612A Unspecified sprain of right middle finger, initial encounter: Secondary | ICD-10-CM

## 2019-12-15 MED ORDER — ACETAMINOPHEN 325 MG PO TABS: 650.0000 mg | ORAL_TABLET | Freq: Once | ORAL | Status: DC

## 2019-12-15 NOTE — ED Provider Notes (Signed)
[facility] Emergency Department Provider Note  ____________________________________________   First MD Initiated Contact with Patient 12/15/19 0404     (approximate)  I have reviewed the triage vital signs and the nursing notes.   HISTORY  Chief Complaint Finger Injury    HPI Gwendolyn Harrison is a 39 y.o. female presents to the emergency department secondary to right middle finger pain and swelling after she states that it was "pulled by a machine at work yesterday.  Patient states that pain is worse with movement of the finger and attempting to flex that finger.       No past medical history on file.  Patient Active Problem List   Diagnosis Date Noted   Forearm fracture, right, closed, initial encounter 07/11/2018    Past Surgical History:  Procedure Laterality Date   HAND SURGERY      Prior to Admission medications   Medication Sig Start Date End Date Taking? Authorizing Provider  TESTOSTERONE PROPIONATE IM Inject into the muscle once a week.    [provider]  traMADol (ULTRAM) 50 MG tablet Take 1 tablet (50 mg total) by mouth every 6 (six) hours as needed. 10/09/18   Johnn Hai, PA-C    Allergies Ibuprofen  No family history on file.  Social History Social History   Tobacco Use   Smoking status: Current Every Day Smoker    Types: Cigarettes   Smokeless tobacco: Never Used  Substance Use Topics   Alcohol use: Not Currently   Drug use: Never    Review of Systems Constitutional: No fever/chills Eyes: No visual changes. ENT: No sore throat. Cardiovascular: Denies chest pain. Respiratory: Denies shortness of breath. Gastrointestinal: No abdominal pain.  No nausea, no vomiting.  No diarrhea.  No constipation. Genitourinary: Negative for dysuria. Musculoskeletal: Negative for neck pain.  Negative for back pain.  Positive for right ring finger pain Integumentary: Negative for rash. Neurological: Negative for  headaches, focal weakness or numbness.   ____________________________________________   PHYSICAL EXAM:  VITAL SIGNS: ED Triage Vitals  Enc Vitals Group     BP 12/14/19 2314 135/83     Pulse Rate 12/14/19 2314 84     Resp 12/14/19 2314 20     Temp 12/14/19 2314 98.4 F (36.9 C)     Temp Source 12/14/19 2314 Oral     SpO2 12/14/19 2314 100 %     Weight 12/14/19 2315 95.3 kg (210 lb)     Height 12/14/19 2315 1.651 m (5\' 5" )     Head Circumference --      Peak Flow --      Pain Score 12/14/19 2315 6     Pain Loc --      Pain Edu? --      Excl. in [place]? --     Constitutional: Alert and oriented.  Eyes: Conjunctivae are normal.  Mouth/Throat: Patient is wearing a mask. Neck: No stridor.  No meningeal signs.   Gastrointestinal: Soft and nontender. No distention.  Musculoskeletal: Right middle finger swelling tenderness to palpation.  Range of motion limited secondary to discomfort. Neurologic:  Normal speech and language. No gross focal neurologic deficits are appreciated.  Skin:  Skin is warm, dry and intact. Psychiatric: Mood and affect are normal. Speech and behavior are normal.   RADIOLOGY I, McCoole, personally viewed and evaluated these images (plain radiographs) as part of my medical decision making, as well as reviewing the written report by the radiologist.  ED  MD interpretation: No acute fracture or dislocation noted on right hand x-ray per radiology  Official radiology report(s): DG Hand Complete Right  Result Date: 12/14/2019 CLINICAL DATA:  Right hand pain, hyperextension injury third digit EXAM: RIGHT HAND - COMPLETE 3+ VIEW COMPARISON:  None. FINDINGS: Frontal, oblique, and lateral views of the right hand are obtained. Prior healed fifth metacarpal fracture. No acute fracture, subluxation, or dislocation. Joint spaces are well preserved. Soft tissues are normal. IMPRESSION: 1. No acute fracture. Electronically Signed   By: Sharlet Salina M.D.   On:  12/14/2019 23:44    ______ Procedures   ____________________________________________   INITIAL IMPRESSION / MDM / ASSESSMENT AND PLAN / ED COURSE  As part of my medical decision making, I reviewed the following data within the electronic MEDICAL RECORD NUMBER   39 year old female presented with above-stated history and physical exam secondary to right middle finger injury.  X-ray revealed no evidence of fracture or dislocation.  Finger splint applied.  ___________________________________  FINAL CLINICAL IMPRESSION(S) / ED DIAGNOSES  Final diagnoses:  Sprain of right middle finger, unspecified site of digit, initial encounter     MEDICATIONS GIVEN DURING THIS VISIT:  Medications - No data to display   ED Discharge Orders    None      *Please note:  [name] was evaluated in Emergency Department on 12/15/2019 for the symptoms described in the history of present illness. She was evaluated in the context of the global COVID-19 pandemic, which necessitated consideration that the patient might be at risk for infection with the SARS-CoV-2 virus that causes COVID-19. Institutional protocols and algorithms that pertain to the evaluation of patients at risk for COVID-19 are in a state of rapid change based on information released by regulatory bodies including the CDC and federal and state organizations. These policies and algorithms were followed during the patient's care in the ED.  Some ED evaluations and interventions may be delayed as a result of limited staffing during the pandemic.*  Note:  This document was prepared using Dragon voice recognition software and may include unintentional dictation errors.   Darci Current, MD 12/19/19 [phone]

## 2020-04-09 ENCOUNTER — Emergency Department: Payer: Medicaid Other

## 2020-04-09 ENCOUNTER — Other Ambulatory Visit: Payer: Self-pay

## 2020-04-09 DIAGNOSIS — M79631 Pain in right forearm: Secondary | ICD-10-CM | POA: Diagnosis not present

## 2020-04-09 DIAGNOSIS — Z5321 Procedure and treatment not carried out due to patient leaving prior to being seen by health care provider: Secondary | ICD-10-CM | POA: Insufficient documentation

## 2020-04-09 NOTE — ED Triage Notes (Signed)
Pt presents via POV c/o right forearm x1 month. Reports "bumped" arm. Reports hx of surgery to same forearm.

## 2020-04-10 ENCOUNTER — Emergency Department
Admission: EM | Admit: 2020-04-10 | Discharge: 2020-04-10 | Disposition: A | Discharge status: Home / Self Care | Payer: Medicaid Other | Attending: Emergency Medicine | Admitting: Emergency Medicine

## 2020-04-10 NOTE — ED Notes (Signed)
No answer when called several times from lobby 

## 2020-11-23 ENCOUNTER — Other Ambulatory Visit: Payer: Self-pay

## 2020-11-23 ENCOUNTER — Encounter: Payer: Self-pay | Admitting: Emergency Medicine

## 2020-11-23 ENCOUNTER — Emergency Department
Admission: EM | Admit: 2020-11-23 | Discharge: 2020-11-23 | Disposition: A | Discharge status: Home / Self Care | Payer: Medicaid Other | Attending: Emergency Medicine | Admitting: Emergency Medicine

## 2020-11-23 DIAGNOSIS — B9689 Other specified bacterial agents as the cause of diseases classified elsewhere: Secondary | ICD-10-CM | POA: Diagnosis not present

## 2020-11-23 DIAGNOSIS — M545 Low back pain, unspecified: Secondary | ICD-10-CM

## 2020-11-23 DIAGNOSIS — N390 Urinary tract infection, site not specified: Secondary | ICD-10-CM

## 2020-11-23 DIAGNOSIS — R509 Fever, unspecified: Secondary | ICD-10-CM | POA: Diagnosis present

## 2020-11-23 DIAGNOSIS — F17210 Nicotine dependence, cigarettes, uncomplicated: Secondary | ICD-10-CM | POA: Insufficient documentation

## 2020-11-23 DIAGNOSIS — R102 Pelvic and perineal pain: Secondary | ICD-10-CM

## 2020-11-23 DIAGNOSIS — N3001 Acute cystitis with hematuria: Secondary | ICD-10-CM | POA: Insufficient documentation

## 2020-11-23 LAB — BASIC METABOLIC PANEL
Anion gap: 12 (ref 5–15)
BUN: 20 mg/dL (ref 6–20)
CO2: 22 mmol/L (ref 22–32)
Calcium: 8.7 mg/dL — ABNORMAL LOW (ref 8.9–10.3)
Chloride: 95 mmol/L — ABNORMAL LOW (ref 98–111)
Creatinine, Ser: 1.47 mg/dL — ABNORMAL HIGH (ref 0.44–1.00)
GFR, Estimated: 47 mL/min — ABNORMAL LOW (ref >60)
Glucose, Bld: 181 mg/dL — ABNORMAL HIGH (ref 70–99)
Potassium: 3.3 mmol/L — ABNORMAL LOW (ref 3.5–5.1)
Sodium: 129 mmol/L — ABNORMAL LOW (ref 135–145)

## 2020-11-23 LAB — URINALYSIS, COMPLETE (UACMP) WITH MICROSCOPIC
Bilirubin Urine: NEGATIVE
Glucose, UA: NEGATIVE mg/dL
Ketones, ur: NEGATIVE mg/dL
Nitrite: NEGATIVE
Protein, ur: 100 mg/dL — AB
Specific Gravity, Urine: 1.028 (ref 1.005–1.030)
WBC, UA: >50 WBC/hpf — ABNORMAL HIGH (ref 0–5)
pH: 5 (ref 5.0–8.0)

## 2020-11-23 LAB — CBC WITH DIFFERENTIAL/PLATELET
Abs Immature Granulocytes: 0 10*3/uL (ref 0.00–0.07)
Basophils Absolute: 0 10*3/uL (ref 0.0–0.1)
Basophils Relative: 0 %
Eosinophils Absolute: 0 10*3/uL (ref 0.0–0.5)
Eosinophils Relative: 0 %
HCT: 43.3 % (ref 36.0–46.0)
Hemoglobin: 14.8 g/dL (ref 12.0–15.0)
Lymphocytes Relative: 9 %
Lymphs Abs: 1.3 10*3/uL (ref 0.7–4.0)
MCH: 28.7 pg (ref 26.0–34.0)
MCHC: 34.2 g/dL (ref 30.0–36.0)
MCV: 83.9 fL (ref 80.0–100.0)
Monocytes Absolute: 0.6 10*3/uL (ref 0.1–1.0)
Monocytes Relative: 4 %
Neutro Abs: 12.4 10*3/uL — ABNORMAL HIGH (ref 1.7–7.7)
Neutrophils Relative %: 87 %
Platelets: 135 10*3/uL — ABNORMAL LOW (ref 150–400)
RBC: 5.16 MIL/uL — ABNORMAL HIGH (ref 3.87–5.11)
RDW: 12.8 % (ref 11.5–15.5)
WBC: 14.2 10*3/uL — ABNORMAL HIGH (ref 4.0–10.5)
nRBC: 0 % (ref 0.0–0.2)

## 2020-11-23 LAB — POC URINE PREG, ED: Preg Test, Ur: NEGATIVE

## 2020-11-23 MED ORDER — CEFDINIR 300 MG PO CAPS: 300.0000 mg | ORAL_CAPSULE | Freq: Two times a day (BID) | ORAL | 0 refills | Status: AC

## 2020-11-23 MED ORDER — ACETAMINOPHEN 325 MG PO TABS
650.0000 mg | ORAL_TABLET | Freq: Once | ORAL | Status: AC | PRN
  Administered 2020-11-23: 650 mg via ORAL
  Filled 2020-11-23: qty 2

## 2020-11-23 NOTE — ED Notes (Signed)
Spoke with lab tech regarding previously sent urine sample and add on urine culture. Adequate sample provided earlier for testing.

## 2020-11-23 NOTE — ED Triage Notes (Signed)
Pt to ED via POV with c/o fever, lower back pain, pelvic pain, and fever. Pt states started with lower back pain 3 days ago, pain has moved into the pelvis now, fever (Tmax at home 102), and decreased appetite.

## 2020-11-24 LAB — URINE CULTURE

## 2020-11-27 NOTE — ED Provider Notes (Signed)
[facility] Emergency Department Provider Note   ____________________________________________   Event Date/Time   First MD Initiated Contact with Patient 11/23/20 1154     (approximate)  I have reviewed the triage vital signs and the nursing notes.   HISTORY  Chief Complaint Pelvic Pain, Fever, and Back Pain    HPI Gwendolyn Harrison is a 40 y.o. female with the below stated past medical history who presents for fever, lower back pain, pelvic pain and decreased appetite that has been worsening over the last 3 days but has no specific exacerbating or relieving factors.  Patient does endorse associated dysuria and urinary frequency.  Patient describes an aching, 10/10 pain in the superior area that radiates through to her back.  Patient currently denies any vision changes, tinnitus, difficulty speaking, facial droop, sore throat, chest pain, shortness of breath,  nausea/vomiting/diarrhea, or weakness/numbness/paresthesias in any extremity         History reviewed. No pertinent past medical history.  Patient Active Problem List   Diagnosis Date Noted  . Forearm fracture, right, closed, initial encounter 07/11/2018    Past Surgical History:  Procedure Laterality Date  . HAND SURGERY      Prior to Admission medications   Medication Sig Start Date End Date Taking? Authorizing Provider  cefdinir (OMNICEF) 300 MG capsule Take 1 capsule (300 mg total) by mouth 2 (two) times daily for 10 days. 11/23/20 12/03/20 Yes Merwyn Katos, MD  TESTOSTERONE PROPIONATE IM Inject into the muscle once a week.    [provider]  traMADol (ULTRAM) 50 MG tablet Take 1 tablet (50 mg total) by mouth every 6 (six) hours as needed. 10/09/18   Tommi Rumps, PA-C    Allergies Ibuprofen  History reviewed. No pertinent family history.  Social History Social History   Tobacco Use  . Smoking status: Current Every Day Smoker    Types: Cigarettes  . Smokeless  tobacco: Never Used  Substance Use Topics  . Alcohol use: Not Currently  . Drug use: Never    Review of Systems Constitutional: Endorses fever/chills Eyes: No visual changes. ENT: No sore throat. Cardiovascular: Denies chest pain. Respiratory: Denies shortness of breath. Gastrointestinal: Endorses abdominal pain.  No nausea, no vomiting.  No diarrhea. Genitourinary: Endorses dysuria. Musculoskeletal: Negative for acute arthralgias Skin: Negative for rash. Neurological: Negative for headaches, weakness/numbness/paresthesias in any extremity Psychiatric: Negative for suicidal ideation/homicidal ideation   ____________________________________________   PHYSICAL EXAM:  VITAL SIGNS: ED Triage Vitals  Enc Vitals Group     BP 11/23/20 1028 (!) 133/97     Pulse Rate 11/23/20 1028 96     Resp 11/23/20 1028 (!) 24     Temp 11/23/20 1028 (!) 100.7 F (38.2 C)     Temp Source 11/23/20 1028 Oral     SpO2 11/23/20 1028 98 %     Weight 11/23/20 1029 210 lb (95.3 kg)     Height 11/23/20 1029 5\' 5"  (1.651 m)     Head Circumference --      Peak Flow --      Pain Score 11/23/20 1033 9     Pain Loc --      Pain Edu? --      Excl. in [place]? --    Constitutional: Alert and oriented. Well appearing and in no acute distress. Eyes: Conjunctivae are normal. PERRL. Head: Atraumatic. Nose: No congestion/rhinnorhea. Mouth/Throat: Mucous membranes are moist. Neck: No stridor Cardiovascular: Grossly normal heart sounds.  Good peripheral circulation.  Respiratory: Normal respiratory effort.  No retractions. Gastrointestinal: Soft and nontender. No distention. Musculoskeletal: No obvious deformities Neurologic:  Normal speech and language. No gross focal neurologic deficits are appreciated. Skin:  Skin is diaphoretic. No rash noted. Psychiatric: Mood and affect are normal. Speech and behavior are normal.  ____________________________________________   LABS (all labs ordered are listed, but  only abnormal results are displayed)  Labs Reviewed  URINE CULTURE - Abnormal; Notable for the following components:      Result Value   Culture MULTIPLE SPECIES PRESENT, SUGGEST RECOLLECTION (*)    All other components within normal limits  URINALYSIS, COMPLETE (UACMP) WITH MICROSCOPIC - Abnormal; Notable for the following components:   Color, Urine AMBER (*)    APPearance HAZY (*)    Hgb urine dipstick MODERATE (*)    Protein, ur 100 (*)    Leukocytes,Ua LARGE (*)    WBC, UA >50 (*)    Bacteria, UA MANY (*)    All other components within normal limits  CBC WITH DIFFERENTIAL/PLATELET - Abnormal; Notable for the following components:   WBC 14.2 (*)    RBC 5.16 (*)    Platelets 135 (*)    Neutro Abs 12.4 (*)    All other components within normal limits  BASIC METABOLIC PANEL - Abnormal; Notable for the following components:   Sodium 129 (*)    Potassium 3.3 (*)    Chloride 95 (*)    Glucose, Bld 181 (*)    Creatinine, Ser 1.47 (*)    Calcium 8.7 (*)    GFR, Estimated 47 (*)    All other components within normal limits  POC URINE PREG, ED    PROCEDURES  Procedure(s) performed (including Critical Care):  .1-3 Lead EKG Interpretation Performed by: Merwyn Katos, MD Authorized by: Merwyn Katos, MD     Interpretation: normal     ECG rate:  95   ECG rate assessment: normal     Rhythm: sinus rhythm     Ectopy: none     Conduction: normal       ____________________________________________   INITIAL IMPRESSION / ASSESSMENT AND PLAN / ED COURSE  As part of my medical decision making, I reviewed the following data within the electronic MEDICAL RECORD NUMBER Nursing notes reviewed and incorporated, Labs reviewed, EKG interpreted, Old chart reviewed, Radiograph reviewed and Notes from prior ED visits reviewed and incorporated        Not Pregnant. Unlikely TOA, Ovarian Torsion, PID, gonorrhea/chlamydia. Low suspicion for Infected Urolithiasis, AAA, Cholecystitis,  Pancreatitis, SBO, Appendicitis, or other acute abdomen.  Rx: Cefdinir 300 mg BID for 5 days Disposition: Discharge home. SRP discussed. Advise follow up with primary care provider within 24-72 hours.      ____________________________________________   FINAL CLINICAL IMPRESSION(S) / ED DIAGNOSES  Final diagnoses:  Urinary tract infection with hematuria, site unspecified  Pelvic pain  Acute bilateral low back pain without sciatica     ED Discharge Orders         Ordered    cefdinir (OMNICEF) 300 MG capsule  2 times daily        11/23/20 1201           Note:  This document was prepared using Dragon voice recognition software and may include unintentional dictation errors.   Merwyn Katos, MD 11/27/20 [phone]

## 2021-02-27 IMAGING — CR DG FOREARM 2V*R*
1 series · 2 of 2 positions shown · non-contrast
Comparison: Preoperative radiographs 07/11/2018

CLINICAL DATA: Right forearm pain for 1 month.  History of surgery.

EXAM:
RIGHT FOREARM - 2 VIEW

[Series 1: dg forearm right · 0.14mm/px · 2 of 2 slices shown]
[im 1/2]
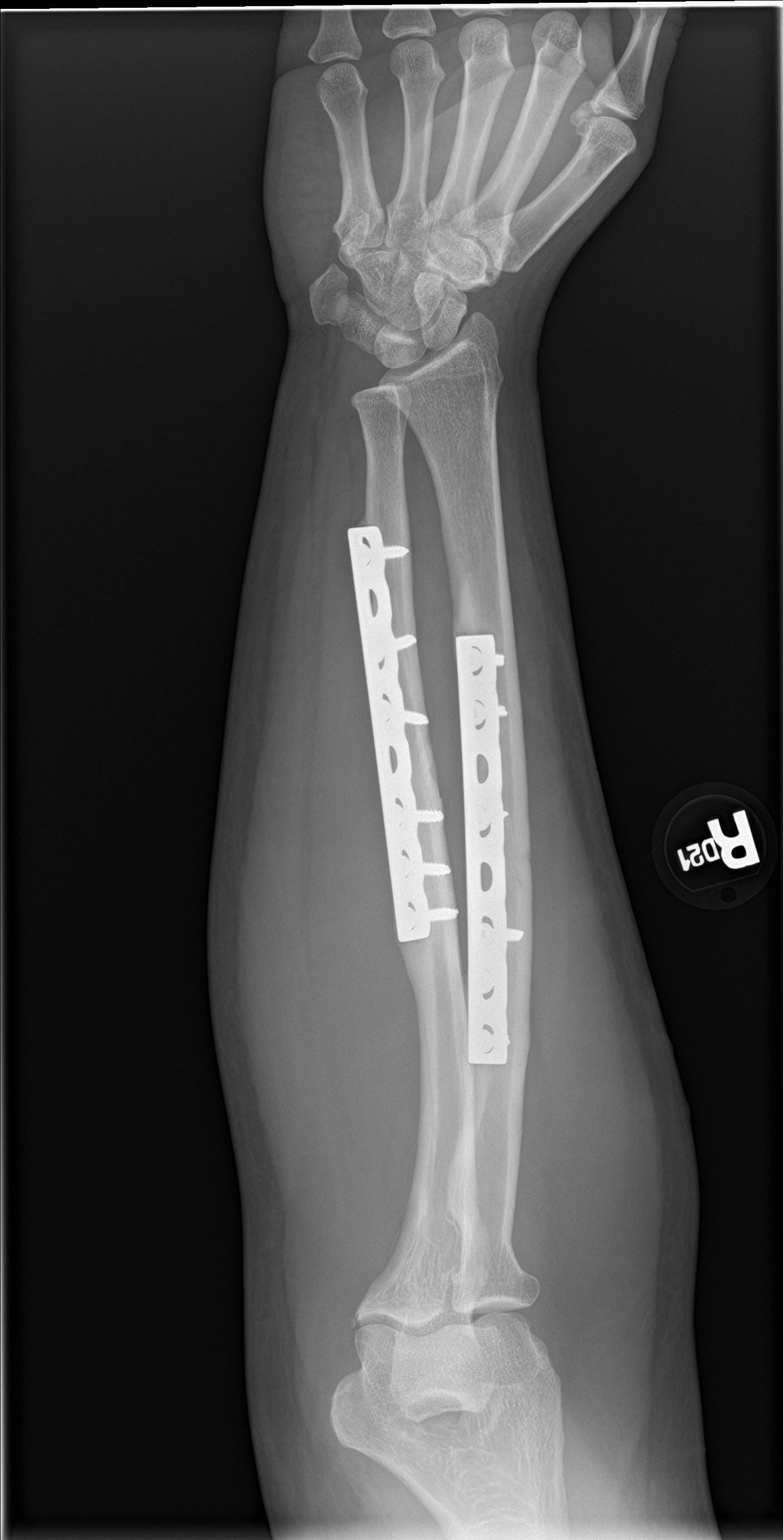
[im 2/2]
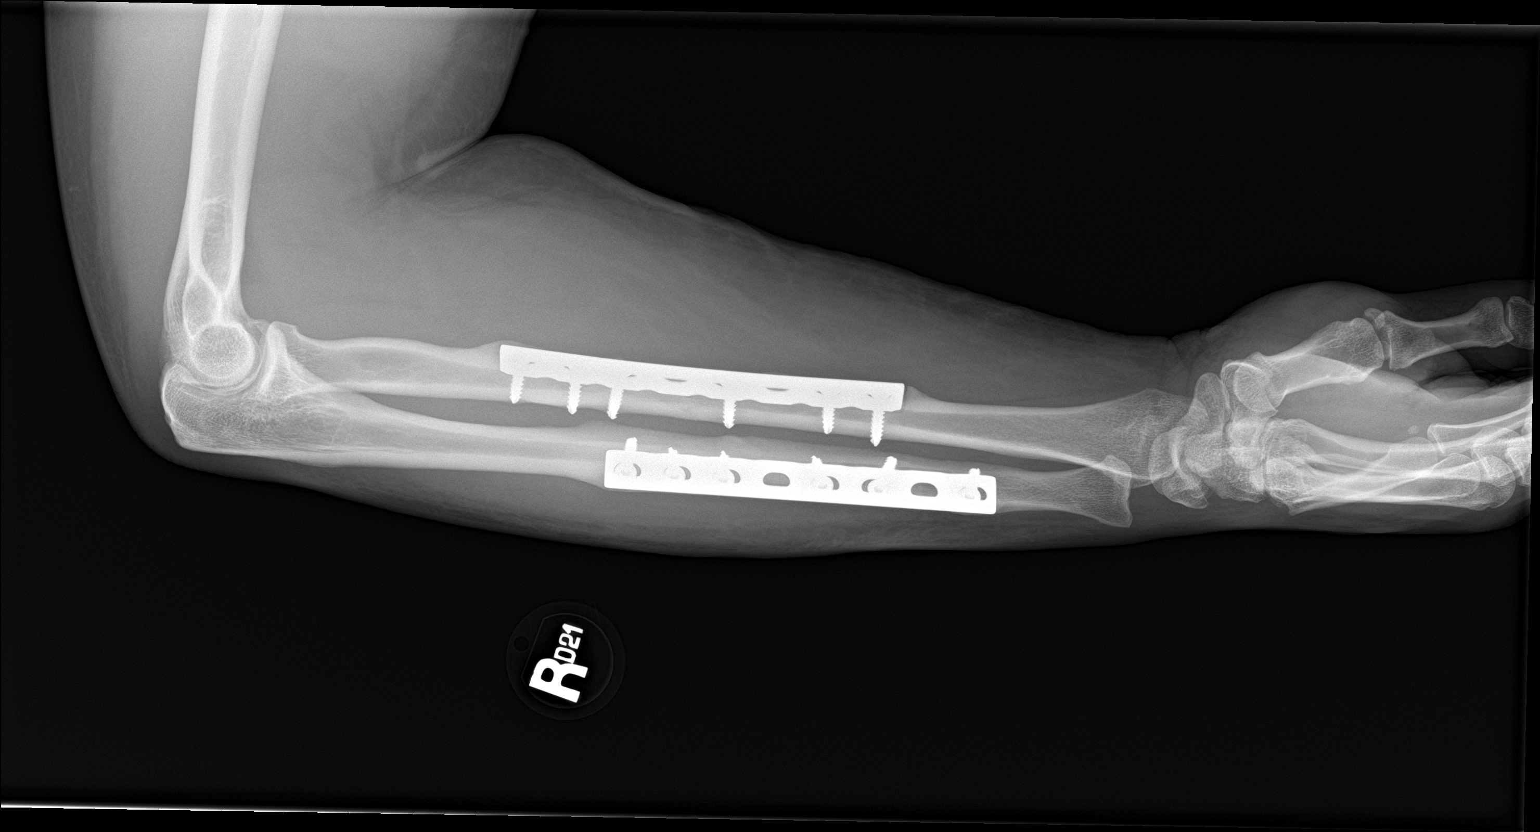

[2 of 2 positions shown; findings below may reference images not displayed]

FINDINGS: Plate and multi screw fixation of prior midshaft radius and ulnar
fractures. The hardware is intact. There is no periprosthetic
lucency. Previous fractures have healed without residual fracture
lucency. Normal wrist and elbow alignment. There is no focal soft
tissue abnormality.
IMPRESSION: Prior ORIF of midshaft radius and ulnar fractures without hardware
complication. Previous fractures have healed.

## 2022-01-12 ENCOUNTER — Other Ambulatory Visit: Payer: Self-pay

## 2022-01-12 ENCOUNTER — Encounter: Payer: Self-pay | Admitting: Emergency Medicine

## 2022-01-12 ENCOUNTER — Emergency Department
Admission: EM | Admit: 2022-01-12 | Discharge: 2022-01-12 | Disposition: A | Discharge status: Home / Self Care | Payer: Medicaid Other | Attending: Emergency Medicine | Admitting: Emergency Medicine

## 2022-01-12 DIAGNOSIS — F17200 Nicotine dependence, unspecified, uncomplicated: Secondary | ICD-10-CM | POA: Insufficient documentation

## 2022-01-12 DIAGNOSIS — K0889 Other specified disorders of teeth and supporting structures: Secondary | ICD-10-CM | POA: Diagnosis present

## 2022-01-12 MED ORDER — AMOXICILLIN 875 MG PO TABS: 875.0000 mg | ORAL_TABLET | Freq: Two times a day (BID) | ORAL | 0 refills | Status: DC

## 2022-01-12 NOTE — Discharge Instructions (Addendum)
OPTIONS FOR DENTAL FOLLOW UP CARE ° °[place] Department of Health and Human Services - Local Safety Net Dental Clinics °http://www.ncdhhs.gov/dph/oralhealth/services/safetynetclinics.htm °  °[facility] (336-562-3123) ° °[facility] [place] ([phone]) ° °[facility] ([phone] ext [phone]) ° °[facility] ([phone]) ° °[facility] ([phone]) °This clinic caters to the indigent population and is on a lottery system. °Location: °[facility], [address], [place] °Clinic Hours: °Wednesdays from 6pm - 9pm, patients seen by a lottery system. °For dates, call or go to [email or web address].[facility].[email or web address]/[facility]/patients/[email or web address]-[facility] °Services: °Cleanings, fillings and simple extractions. °Payment Options: °DENTAL WORK IS FREE OF CHARGE. Bring proof of income or support. °Best way to get seen: °Arrive at 5:15 pm - this is a lottery, NOT first come/first serve, so arriving earlier will not increase your chances of being seen. °  °  °[facility] °[phone] °Select option 1 for emergencies °  °Location: °[facility], [address], [place] °Clinic Hours: °No walk-ins accepted - call the day before to schedule an appointment. °Check in times are 9:30 am and 1:30 pm. °Services: °Simple extractions, temporary fillings, pulpectomy/pulp debridement, uncomplicated abscess drainage. °Payment Options: °PAYMENT IS DUE AT THE TIME OF SERVICE.  Fee is usually $100-200, additional surgical procedures (e.g. abscess drainage) may be extra. °Cash, checks, Visa/MasterCard accepted.  Can file Medicaid if patient is covered for dental - patient should call case worker to check. °No discount for [facility] patients. °Best way to get seen: °MUST call the day before and get onto the schedule. Can usually be seen the next 1-2 days. No walk-ins accepted. °  °  °[facility] °[phone] °   °Location: °[facility], [address], [place] °Clinic Hours: °M, W, Th, F 8am or 1:30pm, Tues 9a or 1:30 - first come/first served. °Services: °Simple extractions, temporary fillings, uncomplicated abscess drainage.  You do not need to be an [place] resident. °Payment Options: °PAYMENT IS DUE AT THE TIME OF SERVICE. °Dental insurance, otherwise sliding scale - bring proof of income or support. °Depending on income and treatment needed, cost is usually $50-200. °Best way to get seen: °Arrive early as it is first come/first served. °  °  °[facility] °[phone] °  °Location: °[address] °Clinic Hours: °Mon-Thu 8a-5p °Services: °Most basic dental services including extractions and fillings. °Payment Options: °PAYMENT IS DUE AT THE TIME OF SERVICE. °Sliding scale, up to 50% off - bring proof if income or support. °Medicaid with dental option accepted. °Best way to get seen: °Call to schedule an appointment, can usually be seen within 2 weeks OR they will try to see walk-ins - show up at 8a or 2p (you may have to wait). °  °  °[facility] °[phone] °[place] RESIDENTS ONLY °  °Location: °[facility], [address], [place], [place] [address] °Clinic Hours: By appointment only. °Monday - Thursday 8am-5pm, Friday 8am-12pm °Services: Cleanings, fillings, extractions. °Payment Options: °PAYMENT IS DUE AT THE TIME OF SERVICE. °Cash, Visa or MasterCard. Sliding scale - $30 minimum per service. °Best way to get seen: °Come in to office, complete packet and make an appointment - need proof of income °or support monies for each household member and proof of [place] residence. °Usually takes about a month to get in. °  °  °[facility] °[phone] °  °Location: °[address]   [place] °Clinic Hours: Walk-in Urgent Care Dental Services are offered Monday-Friday  mornings only. °The numbers of emergencies accepted daily is limited to the number of °providers available. °Maximum 15 - Mondays, Wednesdays & Thursdays °Maximum 10 - Tuesdays & Fridays °Services: °You do not need to be [place] resident to be seen for a dental emergency. °Emergencies are defined as pain, swelling, abnormal bleeding, or dental trauma. Walkins will receive x-rays if needed. °NOTE: Dental cleaning is not an emergency. °Payment Options: °PAYMENT IS DUE AT THE TIME OF SERVICE. °Minimum co-pay is $40.00 for uninsured patients. °Minimum co-pay is $3.00 for Medicaid with dental coverage. °Dental Insurance is accepted and must be presented at time of visit. °Medicare does not cover dental. °Forms of payment: Cash, credit card, checks. °Best way to get seen: °If not previously registered with the clinic, walk-in dental registration begins at 7:15 am and is on a first come/first serve basis. °If previously registered with the clinic, call to make an appointment. °  °  °[facility] °[phone] °[place] RESIDENTS ONLY °  °Location: °[address], [place], [place] °Clinic Hours: °Mon-Thu 10a-2p °Services: Extractions only! °Payment Options: °FREE (donations accepted) - bring proof of income or support °Best way to get seen: °Call and schedule an appointment OR come at 8am on the 1st Monday of every month (except for holidays) when it is first come/first served. °  °  °[facility] °[phone] °  °Location: °[address], [place] °Clinic Hours: °Friday mornings °Services, Payment Options, Best way to get seen: °Call for info °

## 2022-01-12 NOTE — ED Notes (Signed)
See triage note. Presents with some dental and swelling  States he is missing a filling

## 2022-01-12 NOTE — ED Provider Notes (Signed)
[facility] Provider Note    Event Date/Time   First MD Initiated Contact with Patient 01/12/22 1104     (approximate)   History   Dental Pain   HPI  Gwendolyn Harrison is a 41 y.o. female   presents to the ED today with complaint of dental pain for the last few weeks but has gotten worse in the last 24 hours.  Patient states that the feeling in that particular tooth fell out several years ago and was never seen by the dentist.  Patient is a daily smoker but denies any medical problems.      Physical Exam   Triage Vital Signs: ED Triage Vitals  Enc Vitals Group     BP 01/12/22 1045 (!) 154/102     Pulse Rate 01/12/22 1045 80     Resp 01/12/22 1045 17     Temp 01/12/22 1045 98 F (36.7 C)     Temp src --      SpO2 01/12/22 1045 95 %     Weight 01/12/22 1043 220 lb (99.8 kg)     Height 01/12/22 1043 5\' 7"  (1.702 m)     Head Circumference --      Peak Flow --      Pain Score 01/12/22 1043 10     Pain Loc --      Pain Edu? --      Excl. in [place]? --     Most recent vital signs: Vitals:   01/12/22 1045  BP: (!) 154/102  Pulse: 80  Resp: 17  Temp: 98 F (36.7 C)  SpO2: 95%     General: Awake, no distress.  CV:  Good peripheral perfusion.  Resp:  Normal effort.  Abd:  No distention.  Other:  Left lower posterior molar in poor repair and moderately tender to palpation with a tongue depressor.  No obvious drainage noted from the area.  There is also tenderness on palpation of the mandible in the same region.   ED Results / Procedures / Treatments   Labs (all labs ordered are listed, but only abnormal results are displayed) Labs Reviewed - No data to display    PROCEDURES:  Critical Care performed:   Procedures   MEDICATIONS ORDERED IN ED: Medications - No data to display   IMPRESSION / MDM / ASSESSMENT AND PLAN / ED COURSE  I reviewed the triage vital signs and the nursing notes.   Differential diagnosis includes, but is not  limited to, dental pain, dental injury, dental abscess.  41 year old female presents to the ED with complaint of dental pain for last several days.  Patient states that the filling fell out several years ago and was never seen by dentist.  Patient was given a list of dental clinics in the area along with a prescription for amoxicillin 875 twice daily for 10 days.      Patient's presentation is most consistent with acute, uncomplicated illness.  FINAL CLINICAL IMPRESSION(S) / ED DIAGNOSES   Final diagnoses:  Pain, dental     Rx / DC Orders   ED Discharge Orders          Ordered    amoxicillin (AMOXIL) 875 MG tablet  2 times daily        01/12/22 1131             Note:  This document was prepared using Dragon voice recognition software and may include unintentional dictation errors.   03/15/22,  PA-C 01/12/22 1153    Willy Eddy, MD 01/12/22 1241

## 2022-01-12 NOTE — ED Triage Notes (Signed)
Pt reports had a filling fall out of his tooth a few weeks ago and is now having severe pain and some swelling to the left side of his face. Denies fevers

## 2022-08-17 ENCOUNTER — Other Ambulatory Visit: Payer: Self-pay

## 2022-08-17 ENCOUNTER — Emergency Department
Admission: EM | Admit: 2022-08-17 | Discharge: 2022-08-17 | Disposition: A | Discharge status: Home / Self Care | Payer: Medicaid Other | Attending: Student in an Organized Health Care Education/Training Program | Admitting: Student in an Organized Health Care Education/Training Program

## 2022-08-17 DIAGNOSIS — K029 Dental caries, unspecified: Secondary | ICD-10-CM | POA: Insufficient documentation

## 2022-08-17 DIAGNOSIS — K047 Periapical abscess without sinus: Secondary | ICD-10-CM | POA: Insufficient documentation

## 2022-08-17 DIAGNOSIS — K0889 Other specified disorders of teeth and supporting structures: Secondary | ICD-10-CM | POA: Diagnosis present

## 2022-08-17 MED ORDER — AMOXICILLIN 875 MG PO TABS: 875.0000 mg | ORAL_TABLET | Freq: Two times a day (BID) | ORAL | 0 refills | Status: AC

## 2022-08-17 MED ORDER — LIDOCAINE HCL (PF) 1 % IJ SOLN
5.0000 mL | Freq: Once | INTRAMUSCULAR | Status: AC
  Administered 2022-08-17: 5 mL
  Filled 2022-08-17: qty 5

## 2022-08-17 MED ORDER — AMOXICILLIN-POT CLAVULANATE 875-125 MG PO TABS
1.0000 | ORAL_TABLET | Freq: Once | ORAL | Status: AC
  Administered 2022-08-17: 1 via ORAL
  Filled 2022-08-17: qty 1

## 2022-08-17 NOTE — ED Provider Notes (Signed)
[facility] Emergency Department Provider Note     Event Date/Time   First MD Initiated Contact with Patient 08/17/22 1406     (approximate)   History   Dental Pain   HPI  Gwendolyn Harrison is a 42 y.o. female presents to the ED with left-sided dental pain.  Patient reports pain to the left lower jaw at the second and third molars.  Patient denies any difficulty breathing, swallowing, or controlling oral secretions.  She admits to chronically broken molars on the same area.  No reported fevers.  Physical Exam   Triage Vital Signs: ED Triage Vitals  Enc Vitals Group     BP 08/17/22 1442 (!) 139/96     Pulse Rate 08/17/22 1442 77     Resp 08/17/22 1442 18     Temp 08/17/22 1442 98.7 F (37.1 C)     Temp Source 08/17/22 1442 Oral     SpO2 08/17/22 1442 97 %     Weight --      Height --      Head Circumference --      Peak Flow --      Pain Score 08/17/22 1353 10     Pain Loc --      Pain Edu? --      Excl. in [place]? --     Most recent vital signs: Vitals:   08/17/22 1442  BP: (!) 139/96  Pulse: 77  Resp: 18  Temp: 98.7 F (37.1 C)  SpO2: 97%    General Awake, no distress. NAD HEENT NCAT. PERRL. EOMI. No rhinorrhea.  TMs are clear bilaterally.  Mucous membranes are moist.  Uvula is midline and tonsils are flat.  No brawny sublingual edema appreciated.  Patient with composite fillings in place to the lower left second and third molars.  There does appear to be defect consistent with cavities to the intra-dental surfaces between the teeth.  CV:  Good peripheral perfusion.  RESP:  Normal effort.  ABD:  No distention.    ED Results / Procedures / Treatments   Labs (all labs ordered are listed, but only abnormal results are displayed) Labs Reviewed - No data to display   EKG   RADIOLOGY  No results found.   PROCEDURES:  Critical Care performed: No  Procedures   MEDICATIONS ORDERED IN ED: Medications  amoxicillin-clavulanate  (AUGMENTIN) 875-125 MG per tablet 1 tablet (1 tablet Oral Given 08/17/22 1441)  lidocaine (PF) (XYLOCAINE) 1 % injection 5 mL (5 mLs Infiltration Given 08/17/22 1441)     IMPRESSION / MDM / ASSESSMENT AND PLAN / ED COURSE  I reviewed the triage vital signs and the nursing notes.                              Differential diagnosis includes, but is not limited to, abscess, dental caries, dental fracture, AOM, Ludwig's angina, facial cellulitis  Patient's presentation is most consistent with acute, uncomplicated illness.  Patient's diagnosis is consistent with dental pain secondary to dental caries. Patient will be discharged home with prescriptions for Augmentin. Patient is to follow up with a local dental provider as needed or otherwise directed. Patient is given ED precautions to return to the ED for any worsening or new symptoms.     FINAL CLINICAL IMPRESSION(S) / ED DIAGNOSES   Final diagnoses:  Pain due to dental caries  Dental infection     Rx /  DC Orders   ED Discharge Orders          Ordered    amoxicillin (AMOXIL) 875 MG tablet  2 times daily        08/17/22 1424             Note:  This document was prepared using Dragon voice recognition software and may include unintentional dictation errors.    Melvenia Needles, PA-C 08/17/22 1457    Merlyn Lot, MD 08/17/22 [phone]

## 2022-08-17 NOTE — ED Triage Notes (Signed)
Pt presents to ED with c/o of L sided dental pain. Pt unsure of fevers. NAD noted.

## 2022-08-17 NOTE — Discharge Instructions (Addendum)
Take the prescription antibiotic as directed.  Follow-up with one of the dental clinics listed below for treatment.  OPTIONS FOR DENTAL FOLLOW UP CARE  [place] [facility] and [place] [email or web address] [phone])  [facility] [phone])  [place] [phone])  [place] [phone])  [facility] [phone]) This clinic caters to the indigent population and is on a lottery system. Location: [employer] [facility], [place], [place], [place] Clinic Hours: Wednesdays from 6pm - 9pm, patients seen by a lottery system. For dates, call or go to [email or web address] Services: Cleanings, fillings and simple extractions. Payment Options: DENTAL WORK IS FREE OF CHARGE. Bring proof of income or support. Best way to get seen: Arrive at 5:15 pm - this is a lottery, NOT first come/first serve, so arriving earlier will not increase your chances of being seen.     [facility] [phone] Select option 1 for emergencies   Location: [facility], [place], [address], [place] Clinic Hours: No walk-ins accepted - call the day before to schedule an appointment. Check in times are 9:30 am and 1:30 pm. Services: Simple extractions, temporary fillings, pulpectomy/pulp debridement, uncomplicated abscess drainage. Payment Options: PAYMENT IS DUE AT THE TIME OF SERVICE.  Fee is usually $100-200, additional surgical procedures (e.g. abscess drainage) may be extra. Cash, checks, Visa/MasterCard accepted.  Can file Medicaid if patient is covered for dental - patient should call case worker to check. No discount for [facility] patients. Best way to get seen: MUST call the day before and get onto the schedule. Can  usually be seen the next 1-2 days. No walk-ins accepted.     [place] [phone]   Location: [place], [place] Clinic Hours: M, W, Th, F 8am or 1:30pm, Tues 9a or 1:30 - first come/first served. Services: Simple extractions, temporary fillings, uncomplicated abscess drainage.  You do not need to be an [facility] resident. Payment Options: PAYMENT IS DUE AT THE TIME OF SERVICE. Dental insurance, otherwise sliding scale - bring proof of income or support. Depending on income and treatment needed, cost is usually $50-200. Best way to get seen: Arrive early as it is first come/first served.     [facility] [phone]   Location: [place] [facility] Hours: Mon-Thu 8a-5p Services: Most basic dental services including extractions and fillings. Payment Options: PAYMENT IS DUE AT THE TIME OF SERVICE. Sliding scale, up to 50% off - bring proof if income or support. Medicaid with dental option accepted. Best way to get seen: Call to schedule an appointment, can usually be seen within 2 weeks OR they will try to see walk-ins - show up at [place] or 2p (you may have to wait).     [facility] [place] RESIDENTS ONLY   Location: [facility], [place] [address], [place], [place] [address] Clinic Hours: By appointment only. Monday - Thursday 8am-5pm, Friday 8am-12pm Services: Cleanings, fillings, extractions. Payment Options: PAYMENT IS DUE AT THE TIME OF SERVICE. Cash, Visa or MasterCard. Sliding scale - $30 minimum per service. Best way to get seen: Come in to office, complete packet and make an appointment - need proof of income or support monies for each household member and proof of [facility] residence. Usually takes about a month  to get in.     [facility] [phone]   Location: [address] [place] Clinic Hours: Walk-in Urgent Care Dental Services are offered Monday-Friday mornings only. The numbers of emergencies accepted daily is limited to the number of providers available. Maximum 15 - Mondays, Wednesdays & Thursdays Maximum 10 - Tuesdays & Fridays Services: You do not need to be a [facility] resident to be seen for a dental emergency. Emergencies are defined as pain, swelling, abnormal bleeding, or dental trauma. Walkins will receive x-rays if needed. NOTE: Dental cleaning is not an emergency. Payment Options: PAYMENT IS DUE AT THE TIME OF SERVICE. Minimum co-pay is $40.00 for uninsured patients. Minimum co-pay is $3.00 for Medicaid with dental coverage. Dental Insurance is accepted and must be presented at time of visit. Medicare does not cover dental. Forms of payment: Cash, credit card, checks. Best way to get seen: If not previously registered with the clinic, walk-in dental registration begins at 7:15 am and is on a first come/first serve basis. If previously registered with the clinic, call to make an appointment.     [facility] [place] ONLY   Location: [address] [place], [place] Clinic Hours: Mon-Thu 10a-2p Services: Extractions only! Payment Options: FREE (donations accepted) - bring proof of income or support Best way to get seen: Call and schedule an appointment OR come at 8am on the 1st Monday of every month (except for holidays) when it is first come/first served.     [facility] [phone]   Location: [place], [place] Clinic Hours: Friday mornings Services, Payment Options, Best way to get seen: Call for info

## 2024-01-09 ENCOUNTER — Emergency Department

## 2024-01-09 ENCOUNTER — Other Ambulatory Visit: Payer: Self-pay

## 2024-01-09 ENCOUNTER — Encounter (HOSPITAL_COMMUNITY): Payer: Self-pay

## 2024-01-09 ENCOUNTER — Emergency Department
Admission: EM | Admit: 2024-01-09 | Discharge: 2024-01-09 | Disposition: A | Discharge status: Home / Self Care | Attending: Emergency Medicine | Admitting: Emergency Medicine

## 2024-01-09 ENCOUNTER — Inpatient Hospital Stay: Admit: 2024-01-09 | Admitting: Internal Medicine

## 2024-01-09 DIAGNOSIS — R569 Unspecified convulsions: Secondary | ICD-10-CM | POA: Insufficient documentation

## 2024-01-09 DIAGNOSIS — R519 Headache, unspecified: Secondary | ICD-10-CM | POA: Diagnosis present

## 2024-01-09 LAB — COMPREHENSIVE METABOLIC PANEL WITH GFR
ALT: 17 U/L (ref 0–44)
AST: 21 U/L (ref 15–41)
Albumin: 4.3 g/dL (ref 3.5–5.0)
Alkaline Phosphatase: 46 U/L (ref 38–126)
Anion gap: 7 (ref 5–15)
BUN: 17 mg/dL (ref 6–20)
CO2: 27 mmol/L (ref 22–32)
Calcium: 9.2 mg/dL (ref 8.9–10.3)
Chloride: 107 mmol/L (ref 98–111)
Creatinine, Ser: 1.1 mg/dL — ABNORMAL HIGH (ref 0.44–1.00)
GFR, Estimated: >60 mL/min (ref >60)
Glucose, Bld: 97 mg/dL (ref 70–99)
Potassium: 4 mmol/L (ref 3.5–5.1)
Sodium: 141 mmol/L (ref 135–145)
Total Bilirubin: 0.9 mg/dL (ref 0.0–1.2)
Total Protein: 7.5 g/dL (ref 6.5–8.1)

## 2024-01-09 LAB — URINALYSIS, ROUTINE W REFLEX MICROSCOPIC
Bilirubin Urine: NEGATIVE
Glucose, UA: NEGATIVE mg/dL
Hgb urine dipstick: NEGATIVE
Ketones, ur: NEGATIVE mg/dL
Nitrite: NEGATIVE
Protein, ur: NEGATIVE mg/dL
Specific Gravity, Urine: 1.03 (ref 1.005–1.030)
pH: 5 (ref 5.0–8.0)

## 2024-01-09 LAB — CBC
HCT: 46.4 % — ABNORMAL HIGH (ref 36.0–46.0)
Hemoglobin: 14.5 g/dL (ref 12.0–15.0)
MCH: 28.3 pg (ref 26.0–34.0)
MCHC: 31.3 g/dL (ref 30.0–36.0)
MCV: 90.6 fL (ref 80.0–100.0)
Platelets: 169 K/uL (ref 150–400)
RBC: 5.12 MIL/uL — ABNORMAL HIGH (ref 3.87–5.11)
RDW: 12.7 % (ref 11.5–15.5)
WBC: 7.2 K/uL (ref 4.0–10.5)
nRBC: 0 % (ref 0.0–0.2)

## 2024-01-09 LAB — CBG MONITORING, ED: Glucose-Capillary: 108 mg/dL — ABNORMAL HIGH (ref 70–99)

## 2024-01-09 LAB — POC URINE PREG, ED: Preg Test, Ur: NEGATIVE

## 2024-01-09 MED ORDER — ONDANSETRON HCL 4 MG/2ML IJ SOLN
4.0000 mg | INTRAMUSCULAR | Status: AC
  Administered 2024-01-09: 4 mg via INTRAVENOUS
  Filled 2024-01-09: qty 2

## 2024-01-09 MED ORDER — LORAZEPAM 2 MG/ML IJ SOLN
2.0000 mg | Freq: Once | INTRAMUSCULAR | Status: AC
  Administered 2024-01-09: 2 mg via INTRAVENOUS
  Filled 2024-01-09: qty 1

## 2024-01-09 MED ORDER — ACETAMINOPHEN 500 MG PO TABS
1000.0000 mg | ORAL_TABLET | ORAL | Status: AC
  Administered 2024-01-09: 1000 mg via ORAL
  Filled 2024-01-09: qty 2

## 2024-01-09 NOTE — ED Provider Notes (Signed)
 [facility] Provider Note    Event Date/Time   First MD Initiated Contact with Patient 01/09/24 [phone]     (approximate)   History   Headache  EM caveat, patient with confusion, sudden change in mental status while examining  HPI  Gwendolyn Harrison is a 43 y.o. female who has a history of hormone therapy.  Identifies as female gender.  Per the patient's fianc who is with the do not believe that the patient will be pregnant.  Patient's fianc provides majority the history.  Last evening started complaining of a severe headache and photosensitivity and then had a couple episodes of briefly passing out with slight shaking in the left hand.  Has had ongoing headache not as conversant as typical and more fatigued since last night.  There is no known history of use of other medications, just testosterone, and is allergic to ibuprofen.  Does not report any recent illness no fevers.  Patient initially reports headache.  Denies any fevers or chest pain.  No neck pain.  No difficulty breathing.  Reports a remote history of a potential traumatic brain injury to his fianc and myself.        Physical Exam   Triage Vital Signs: ED Triage Vitals  Encounter Vitals Group     BP 01/09/24 0926 (!) 151/105     Girls Systolic BP Percentile --      Girls Diastolic BP Percentile --      Boys Systolic BP Percentile --      Boys Diastolic BP Percentile --      Pulse Rate 01/09/24 0925 60     Resp 01/09/24 0925 18     Temp 01/09/24 0925 98 F (36.7 C)     Temp Source 01/09/24 0925 Oral     SpO2 01/09/24 0925 100 %     Weight --      Height --      Head Circumference --      Peak Flow --      Pain Score 01/09/24 0925 10     Pain Loc --      Pain Education --      Exclude from Growth Chart --     Most recent vital signs: Vitals:   01/09/24 1030 01/09/24 1230  BP: (!) 141/100 (!) 139/99  Pulse: (!) 57 71  Resp: 15 (!) 21  Temp:    SpO2: 99% 100%      General: Awake, verbalizes some but very photosensitive.  Extraocular movements are normal, patient is slightly agitated not in a combative type sense but lays on side does not seem to be able to get comfortable.  Reports bad headache CV:  Good peripheral perfusion.  Normal tones and rate Resp:  Normal effort.  Clear bilateral with normal work of breathing Abd:  No distention.  Not obviously gravid, no noted tenderness throughout Other:  Moves all extremities sits up but seems slightly confused.  Not able to ascertain good orientation at this time and appears very uncomfortable.  Severe photophobia.  Pupils are equal round and reactive.  ----------------------------------------- 9:49 AM on 01/09/2024 ----------------------------------------- Patient witnessed to briefly become less responsive for about 5 to 10 seconds then seems confused trying to sit up and is disoriented while I was evaluating for initial exam.  Able to be redirected back into lay down and be in bed by his partner but appears acutely more confused.  ED Results / Procedures / Treatments   Labs (  all labs ordered are listed, but only abnormal results are displayed) Labs Reviewed  CBC - Abnormal; Notable for the following components:      Result Value   RBC 5.12 (*)    HCT 46.4 (*)    All other components within normal limits  COMPREHENSIVE METABOLIC PANEL WITH GFR - Abnormal; Notable for the following components:   Creatinine, Ser 1.10 (*)    All other components within normal limits  URINALYSIS, ROUTINE W REFLEX MICROSCOPIC - Abnormal; Notable for the following components:   Color, Urine YELLOW (*)    APPearance HAZY (*)    Leukocytes,Ua TRACE (*)    Bacteria, UA RARE (*)    All other components within normal limits  CBG MONITORING, ED - Abnormal; Notable for the following components:   Glucose-Capillary 108 (*)    All other components within normal limits  PROTIME-INR  POC URINE PREG, ED     EKG  EKG  interpreted by me at 1015, heart rate 60 QRS 80 QTc 420.  Normal sinus rhythm, early repolarization abnormality noted across multiple leads.  Not consistent with STEMI.  RADIOLOGY  CT Head Wo Contrast Result Date: 01/09/2024 CLINICAL DATA:  Head trauma, repeat vomiting EXAM: CT HEAD WITHOUT CONTRAST TECHNIQUE: Contiguous axial images were obtained from the base of the skull through the vertex without intravenous contrast. RADIATION DOSE REDUCTION: This exam was performed according to the departmental dose-optimization program which includes automated exposure control, adjustment of the mA and/or kV according to patient size and/or use of iterative reconstruction technique. COMPARISON:  None Available. FINDINGS: Brain: No acute intracranial hemorrhage. No focal mass lesion. No CT evidence of acute infarction. No midline shift or mass effect. No hydrocephalus. Basilar cisterns are patent. Vascular: No hyperdense vessel or unexpected calcification. Skull: Normal. Negative for fracture or focal lesion. Sinuses/Orbits: Paranasal sinuses and mastoid air cells are clear. Orbits are clear. Other: None. IMPRESSION: No acute intracranial findings. Electronically Signed   By: Jackquline Boxer M.D.   On: 01/09/2024 10:22       PROCEDURES:  Critical Care performed: No  Procedures   MEDICATIONS ORDERED IN ED: Medications  LORazepam  (ATIVAN ) injection 2 mg (2 mg Intravenous Given 01/09/24 0955)  ondansetron  (ZOFRAN ) injection 4 mg (4 mg Intravenous Given 01/09/24 1000)  acetaminophen  (TYLENOL ) tablet 1,000 mg (1,000 mg Oral Given 01/09/24 1342)     IMPRESSION / MDM / ASSESSMENT AND PLAN / ED COURSE  I reviewed the triage vital signs and the nursing notes.                              Differential diagnosis includes, but is not limited to, possible acute central cause such as intracranial hemorrhage, mass lesion tumor etc.  Patient also relates symptoms that are somewhat suspicious for potential seizure,  with intermittent change in responsiveness and slight confused state thereafter.  Symptoms have been ongoing since last night.  Afebrile normal white count no neck pain or stiffness.  No clinical signs or symptoms that would highly suggest acute encephalitis or meningitis.  Carries no history of immunosuppression or HIV.  He has no acute cardiopulmonary symptoms.  He is afebrile normal white count vital signs reassuring.  Symptoms however are concerning for potential brief seizures, or other central neurologic cause in particular including need for neurology consult which Dr. Matthews has provided.  Patient's presentation is most consistent with acute complicated illness / injury requiring diagnostic workup.  Clinical Course as of 01/09/24 1712  Sat Jan 09, 2024  1032 CT head negative for acute.  CT head grossly interpreted by me. nursing advised the patient is now fully alert oriented conversant.  Query if the patient may have had seizure, has shown improvement in mentation and orientation and examination after receiving Ativan .  I have consulted Dr. Matthews [MQ]  1120 Patient slightly somnolent likely secondary to Ativan , but is much more calm.  Alerts to voice.  Appears clinically improving [MQ]  1317 Dr. Matthews just completed evaluation. Recommends [facility] to [facility] for [facility]y evaluation and EEG monitoring. Question if seizures, though somewhat atypical in presentation.  [MQ]  1521 Patient currently resting, awaiting transfer to [facility].  Neurology has recommended obtaining MRI of the brain with and without contrast, order placed.  Dr. Matthews to follow-up on [MQ]    Clinical Course User Index [MQ] Dicky Anes, MD   ----------------------------------------- 1:58 PM on 01/09/2024 ----------------------------------------- Patient has been accepted in transfer to [facility] by Dr. JONELLE Sharps.  EEG monitoring anticipated.  Discussed with Dr. Matthews whose consult note is not yet at available,  but recommends that MRI be obtained as well as EEG monitoring.  Dr. Matthews did advise when we discussed that she also did not see any clinical evidence that would suggest need for a lumbar puncture at this time but instead feel that EEG monitoring and MRI would be a prudent neck step in his workup.  The patient as well as his fiance are understanding with plan to transfer.  Patient initially was hesitant to transfer and at 1 point had requested to go home, but after discussing further with himself and fianc who he also elicits his emergency contact, he is agreeable with plan to transfer to [facility].  I gave report to Dr. Maximino Sharps.  ----------------------------------------- 4:00 PM on 01/09/2024 ----------------------------------------- Patient advising nursing that he would like to be discharged.  I reassessed him he is alert oriented, he is oriented to it being July 2025 recognizes fianc.  He is fully alert and oriented at this time and refuses admission.  He states that he works, and cannot stay overnight in the hospital.  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  I offered to provide him a work note excuse, discussed the need for admission, potentially life threatening symtptoms, and I strongly. To reiterate extremely strongly recommended to both him and his fiance that he needs to be admitted to the hospital for concerns that he could potentially be having a seizure, cause such as stroke, or other emergent cause for his symptoms today but the patient after discussing the risks reiterating the risk and discussing with his Fiance also advises that he is unwilling to be admitted to the hospital.   It is reassuring that his mental status has returned to his baseline he is alert well oriented in no distress at this time, but his clinical presentation nonetheless is still quite concerning to me and he understands this but has declined  admission or further treatment.  Strongly counseled the patient on the need for close follow-up with primary care, careful return precautions and also discussed with both him and his partner need to call 911 right away if his symptoms recur or any other concerning change.  He is agreeable, is agreeable to leaving AGAINST MEDICAL ADVICE.  At this time he is awake alert well-oriented demonstrates capacity  and understanding of the risk of discharge and clearly understands my recommendation for admission for further workup including EEG, MRI and further neurology evaluation but has declined admission or futher. Leaving AMA, for which patient also signed AMA form. Updated Dr. Matthews and Dr. JONELLE Sharps of AMA.  FINAL CLINICAL IMPRESSION(S) / ED DIAGNOSES   Final diagnoses:  Acute nonintractable headache, unspecified headache type  Seizure-like activity (HCC)     Rx / DC Orders   ED Discharge Orders     None        Note:  This document was prepared using Dragon voice recognition software and may include unintentional dictation errors.   Dicky Anes, MD 01/09/24 [phone]

## 2024-01-09 NOTE — ED Notes (Addendum)
 This RN ambulated pt to the toilet. Pt's gait unsteady. Pt currently resting in bed, call light within reach.

## 2024-01-09 NOTE — ED Notes (Signed)
 [facility] called spoke with Tara in reference to potential transfer.  Requesting Hospitalist service.

## 2024-01-09 NOTE — Plan of Care (Signed)
 Transferred from [facility] Patient is a 43 year old transgender female with pmh of TBI and polysubstance abuse who presents with transient episodes of unresponsiveness.  Developed headache last night and this morning reportedly had total of 4 episodes of unresponsiveness 1 of which which had been witnessed by Dr. Dicky ED provider and Dr. Matthews of neurology.  Patient goes unresponsive for approximately 30 seconds and then comes to but has confusion thereafter.  Labs are relatively unremarkable.  Transfer requested for need of EEG monitoring.  Noted to have possible cuts on the upper extremity.  Additional further workup including echocardiogram, etc. Accepted to a medical telemetry bed.

## 2024-01-09 NOTE — ED Notes (Signed)
 Pt reports they want to go home. GCS 15, will have Pt sign AMA form at this time

## 2024-01-09 NOTE — ED Notes (Signed)
 This RN at bedside when pt started having full body convulsions that lasted approximally 20 seconds. Pt unresponsive during convulsions. Once convulsions ended pt was alert but slow to respond. MD notified. VSS.

## 2024-01-09 NOTE — Discharge Instructions (Signed)
 Please return to the ER by calling 911 for EMS right away if your symptoms return, any other concerns for possible seizure, confusion, fevers stiff neck headache or other concerns arise.  No driving today

## 2024-01-09 NOTE — ED Notes (Addendum)
 Pt stated they can not stay in hospital. Pt educated on importance of getting MRI and EEG. Pt stated they did not want to stay, MD informed. MD going to bedside to speak to patient at this time. [facility] is 10 mins out, report to nurse and [facility] have not been called yet. Will confirm whether Pt is staying or leaving AMA

## 2024-01-09 NOTE — ED Triage Notes (Signed)
 Pt here with a headache and dizziness that started last night. Pt woke up this morning to more pressure in the front of his head. Pt endorses photosensitivity. Pt also having a small amount of nausea but denies vomiting or diarrhea.

## 2024-01-09 NOTE — ED Notes (Signed)
 Pt is more alert at this time. Knows year and location. A&Ox4 but drowsy. MD notified.

## 2024-07-19 ENCOUNTER — Other Ambulatory Visit: Payer: Self-pay | Admitting: Family

## 2024-07-19 DIAGNOSIS — R1032 Left lower quadrant pain: Secondary | ICD-10-CM

## 2024-07-22 ENCOUNTER — Ambulatory Visit
Admission: RE | Admit: 2024-07-22 | Discharge: 2024-07-22 | Disposition: A | Discharge status: Home / Self Care | Source: Ambulatory Visit | Attending: Family | Admitting: Family

## 2024-07-22 DIAGNOSIS — R1032 Left lower quadrant pain: Secondary | ICD-10-CM | POA: Diagnosis present

## 2024-07-27 ENCOUNTER — Ambulatory Visit: Payer: Self-pay

## 2024-07-27 DIAGNOSIS — Z113 Encounter for screening for infections with a predominantly sexual mode of transmission: Secondary | ICD-10-CM

## 2024-07-27 DIAGNOSIS — A539 Syphilis, unspecified: Secondary | ICD-10-CM

## 2024-07-27 MED ORDER — DOXYCYCLINE HYCLATE 100 MG PO TABS: 100.0000 mg | ORAL_TABLET | Freq: Two times a day (BID) | ORAL | Status: AC

## 2024-07-27 NOTE — Progress Notes (Addendum)
 " [facility] STI clinic [address], [address] [place] [address] Main phone: [phone]  STI screening visit  Subjective:  Gwendolyn Harrison is a 44 y.o. adult being seen today for an STI screening visit. The patient reports they do have symptoms.    Patient has the following medical conditions:  Patient Active Problem List   Diagnosis Date Noted   Forearm fracture, right, closed, initial encounter 07/11/2018   Chief Complaint  Patient presents with   SEXUALLY TRANSMITTED DISEASE   HPI Patient reports positive syphilis test at [facility]. He reports never having symptoms. Last negative syphilis test was in 2019. 1 partner in the last 2 months, 2 in the last year. Does not have periods, on testosterone for gender affirming care. Does not want any further testing today. Discussed my recommendation to have a repeat RPR drawn, he declined. He also declined all other STI testing today.  Reproductive considerations: Does the patient or their partner desires a pregnancy in the next year? No  See flowsheet for further details and programmatic requirements  Hyperlink available at the top of the signed note in blue.  Flow sheet content below:  Pregnancy Intention Screening Does the patient want to become pregnant in the next year?: No Does the patient's partner want to become pregnant in the next year?: N/A Would the patient like to discuss contraceptive options today?: No Reason For STD Screen STD Screening: Was referred Have you ever had an STD?: No STD Symptoms Denies all: Yes Counseling Patient counseled to use condoms with all sex: Condoms given RTC in 2-3 weeks for test results: Yes Clinic will call if test results abnormal before test result appt.: Yes Immunizations: Referred, Immunization history assessed Test results given to patient Patient counseled to use condoms with all sex: Condoms given  Screening for MPX risk:  Unexplained rash?   No   MSM?  No   Multiple or anonymous sex partners?  No   Any close or sexual contact with a person  diagnosed with MPX?  No   Any outside the [place]  where MPX is endemic?  No   High clinical suspicion for MPX?    -Unlikely to be chickenpox    -Lymphadenopathy    -Rash that presents in same phase of       evolution on any given body part  No   Does this patient meet CDC recommendations for vaccination against MPOX? No  You already have or anticipate having the following risks:  Your sex partner has the following risks: You're traveling to a county with a clade I MPOX outbreak and anticipate these risks: Occupational exposure  You had known or suspected exposure to someone with monkeypox You had a sex partner in the past 2 weeks who was diagnosed with monkeypox You are a gay, bisexual, or other man who has sex with men, or are transgender or nonbinary and in the past 6 months have had any of the following: - A new diagnosis of one or more sexually transmitted diseases (e.g., chlamydia, gonorrhea, or syphilis) - More than one sex partner You have had any of the following in the past 6 months: - Sex at a commercial sex venue (like a sex club or bathhouse) - Sex related to a large commercial event   or in a geographic area (city or county for example) where mpox virus transmission is occurring Sex with a new partner Sex at a commercial sex venue (e.g., a sex club or  bathhouse) Sex in exchange for money, goods, drugs, or other trade Sex in association with a large public event (e.g., a rave, party, or festival) i.e. certain people who work in a tax adviser or healthcare facility   Infectious disease screenings: Vaccinated against HPV? Yes  HIV Ever had a positive? No Last test: 2019 Results in chart:  No results found for: HMHIVSCREEN No results found for: HIV   Hep B Hep B status: unknown or no prior testing Received HBV vaccination? Yes Received HBV testing for immunity?  Unknown Results in chart:  No components found for: HMHEPBSCREEN  Do they qualify for HBV screening today? Declines all testing Criteria:  -Household, sexual or needle sharing contact with HBV -History of drug use or homelessness -HIV positive -Those with known Hep C  Hep C Hep C status: negative on 2026 Results in chart:  No results found for: HMHEPCSCREEN No components found for: HEPC  Do they qualify for HCV screening today? Declines all testing Criteria - since the last HCV result, does the patient have any of the following? - Current drug use - Have a partner with drug use - Has been incarcerated  Immunization history:   There is no immunization history on file for this patient.  The following portions of the patient's history were reviewed and updated as appropriate: allergies, current medications, past medical history, past social history, past surgical history and problem list.  There is no immunization history on file for this patient.  The following portions of the patient's history were reviewed and updated as appropriate: allergies, current medications, past medical history, past social history, past surgical history and problem list.  Objective:  There were no vitals filed for this visit.  Physical Exam Constitutional:      Appearance: Normal appearance.  HENT:     Head: Normocephalic.     Mouth/Throat:     Mouth: Mucous membranes are moist.  Eyes:     General: No scleral icterus.       Right eye: No discharge.        Left eye: No discharge.  Pulmonary:     Effort: Pulmonary effort is normal.  Skin:    General: Skin is warm and dry.     Findings: No rash.  Neurological:     General: No focal deficit present.     Mental Status: He is alert.  Psychiatric:        Mood and Affect: Mood normal.        Behavior: Behavior normal.    Assessment and Plan:  Gwendolyn Harrison is a 43 y.o. adult presenting to the [facility] Department for STI  screening.  Patient accepted the following screenings: none, declined all screenings  1. Syphilis (Primary)  - Declined repeat RPR/confirmatory test today, titer at [facility] 1:2 - Last syphilis test was 6 years ago - Has not had any symptoms, 2 partners in the last year - doxycycline  (VIBRA -TABS) 100 MG tablet; Take 1 tablet (100 mg total) by mouth 2 (two) times daily for 28 days.  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 Recommended returning for continued or worsening symptoms.   Return in about 3 months (around 10/25/2024).  No future appointments.  Damien FORBES Satchel, NP "
